# Patient Record
Sex: Female | Born: 1975 | Race: Black or African American | Hispanic: No
Health system: Midwestern US, Community
[De-identification: ages and names within clinical notes are randomized; demographics above are authoritative.]

## PROBLEM LIST (undated history)

## (undated) DIAGNOSIS — N809 Endometriosis, unspecified: Secondary | ICD-10-CM

## (undated) DIAGNOSIS — E7800 Pure hypercholesterolemia, unspecified: Secondary | ICD-10-CM

## (undated) DIAGNOSIS — K219 Gastro-esophageal reflux disease without esophagitis: Secondary | ICD-10-CM

## (undated) DIAGNOSIS — R519 Headache, unspecified: Secondary | ICD-10-CM

## (undated) DIAGNOSIS — D219 Benign neoplasm of connective and other soft tissue, unspecified: Secondary | ICD-10-CM

## (undated) DIAGNOSIS — F419 Anxiety disorder, unspecified: Secondary | ICD-10-CM

## (undated) DIAGNOSIS — R011 Cardiac murmur, unspecified: Secondary | ICD-10-CM

## (undated) HISTORY — PX: OVARIAN CYST SURGERY: SHX726

## (undated) HISTORY — PX: BREAST SURGERY: SHX581

## (undated) HISTORY — PX: COLPOSCOPY: SHX161

## (undated) HISTORY — DX: Anxiety disorder, unspecified: F41.9

## (undated) HISTORY — DX: Pure hypercholesterolemia, unspecified: E78.00

## (undated) HISTORY — PX: LAPAROSCOPY: SHX197

## (undated) HISTORY — PX: ABDOMINAL HYSTERECTOMY: SHX81

## (undated) HISTORY — DX: Cardiac murmur, unspecified: R01.1

---

## 1997-05-01 HISTORY — PX: OVARIAN CYST SURGERY: SHX726

## 1997-05-01 HISTORY — PX: LAPAROSCOPY: SHX197

## 2011-08-14 ENCOUNTER — Encounter (HOSPITAL_BASED_OUTPATIENT_CLINIC_OR_DEPARTMENT_OTHER): Payer: Self-pay | Admitting: *Deleted

## 2011-08-14 ENCOUNTER — Emergency Department (HOSPITAL_BASED_OUTPATIENT_CLINIC_OR_DEPARTMENT_OTHER)
Admission: EM | Admit: 2011-08-14 | Discharge: 2011-08-14 | Disposition: A | Discharge status: Home / Self Care | Payer: Self-pay | Attending: Emergency Medicine | Admitting: Emergency Medicine

## 2011-08-14 DIAGNOSIS — R109 Unspecified abdominal pain: Secondary | ICD-10-CM | POA: Insufficient documentation

## 2011-08-14 DIAGNOSIS — M791 Myalgia, unspecified site: Secondary | ICD-10-CM

## 2011-08-14 DIAGNOSIS — R1110 Vomiting, unspecified: Secondary | ICD-10-CM | POA: Insufficient documentation

## 2011-08-14 DIAGNOSIS — IMO0001 Reserved for inherently not codable concepts without codable children: Secondary | ICD-10-CM | POA: Insufficient documentation

## 2011-08-14 DIAGNOSIS — R197 Diarrhea, unspecified: Secondary | ICD-10-CM | POA: Insufficient documentation

## 2011-08-14 DIAGNOSIS — F17200 Nicotine dependence, unspecified, uncomplicated: Secondary | ICD-10-CM | POA: Insufficient documentation

## 2011-08-14 LAB — COMPREHENSIVE METABOLIC PANEL
Alkaline Phosphatase: 92 U/L (ref 39–117)
BUN: 10 mg/dL (ref 6–23)
Creatinine, Ser: 0.7 mg/dL (ref 0.50–1.10)
GFR calc Af Amer: >90 mL/min (ref >90)
Glucose, Bld: 90 mg/dL (ref 70–99)
Potassium: 4.2 mEq/L (ref 3.5–5.1)
Total Bilirubin: 0.9 mg/dL (ref 0.3–1.2)
Total Protein: 8.7 g/dL — ABNORMAL HIGH (ref 6.0–8.3)

## 2011-08-14 LAB — URINALYSIS, ROUTINE W REFLEX MICROSCOPIC
Bilirubin Urine: NEGATIVE
Glucose, UA: NEGATIVE mg/dL
Ketones, ur: NEGATIVE mg/dL
Protein, ur: NEGATIVE mg/dL
Urobilinogen, UA: 0.2 mg/dL (ref 0.0–1.0)

## 2011-08-14 LAB — URINE MICROSCOPIC-ADD ON

## 2011-08-14 LAB — CBC
HCT: 41.7 % (ref 36.0–46.0)
Hemoglobin: 13.9 g/dL (ref 12.0–15.0)
MCH: 22.1 pg — ABNORMAL LOW (ref 26.0–34.0)
MCHC: 33.3 g/dL (ref 30.0–36.0)
MCV: 66.2 fL — ABNORMAL LOW (ref 78.0–100.0)
RDW: 17.4 % — ABNORMAL HIGH (ref 11.5–15.5)

## 2011-08-14 LAB — DIFFERENTIAL
Basophils Relative: 0 % (ref 0–1)
Eosinophils Absolute: 0 10*3/uL (ref 0.0–0.7)
Lymphocytes Relative: 7 % — ABNORMAL LOW (ref 12–46)
Lymphs Abs: 0.8 10*3/uL (ref 0.7–4.0)
Neutro Abs: 10.1 10*3/uL — ABNORMAL HIGH (ref 1.7–7.7)

## 2011-08-14 LAB — PREGNANCY, URINE: Preg Test, Ur: NEGATIVE

## 2011-08-14 MED ORDER — KETOROLAC TROMETHAMINE 30 MG/ML IJ SOLN
30.0000 mg | Freq: Once | INTRAMUSCULAR | Status: AC
  Administered 2011-08-14: 30 mg via INTRAVENOUS
  Filled 2011-08-14: qty 1

## 2011-08-14 MED ORDER — ONDANSETRON HCL 4 MG/2ML IJ SOLN
4.0000 mg | Freq: Once | INTRAMUSCULAR | Status: AC
  Administered 2011-08-14: 4 mg via INTRAVENOUS
  Filled 2011-08-14: qty 2

## 2011-08-14 MED ORDER — DIPHENOXYLATE-ATROPINE 2.5-0.025 MG PO TABS: 1.0000 | ORAL_TABLET | Freq: Four times a day (QID) | ORAL | Status: AC | PRN

## 2011-08-14 MED ORDER — IBUPROFEN 600 MG PO TABS: 600.0000 mg | ORAL_TABLET | Freq: Four times a day (QID) | ORAL | Status: AC | PRN

## 2011-08-14 MED ORDER — METHOCARBAMOL 500 MG PO TABS: 500.0000 mg | ORAL_TABLET | Freq: Two times a day (BID) | ORAL | Status: AC

## 2011-08-14 MED ORDER — METHOCARBAMOL 100 MG/ML IJ SOLN
1000.0000 mg | Freq: Once | INTRAMUSCULAR | Status: AC
  Administered 2011-08-14: 1000 mg via INTRAVENOUS
  Filled 2011-08-14: qty 10

## 2011-08-14 MED ORDER — LOPERAMIDE HCL 2 MG PO CAPS
2.0000 mg | ORAL_CAPSULE | ORAL | Status: DC | PRN
  Administered 2011-08-14: 2 mg via ORAL
  Filled 2011-08-14: qty 1

## 2011-08-14 MED ORDER — SODIUM CHLORIDE 0.9 % IV BOLUS (SEPSIS)
1000.0000 mL | Freq: Once | INTRAVENOUS | Status: AC
  Administered 2011-08-14: 1000 mL via INTRAVENOUS

## 2011-08-14 NOTE — ED Notes (Signed)
Vomiting diarrhea body aches since yesterday.

## 2011-08-14 NOTE — ED Provider Notes (Signed)
History     CSN: [id number]  Arrival date & time 08/14/11  1300   First MD Initiated Contact with Patient 08/14/11 1314      Chief Complaint  Patient presents with  . GI Problem    (Consider location/radiation/quality/duration/timing/severity/associated sxs/prior treatment) HPI Pt with vomiting starting this AM at 0300 and has had 1 other episode. Pt's main complaints is constant diarrhea starting this AM and body aches. States she ate at a party yesterday evening. No sick contacts. No fever, chills. Abd pain that is generalized and cramping.  History reviewed. No pertinent past medical history.  Past Surgical History  Procedure Date  . Colposcopy     No family history on file.  History  Substance Use Topics  . Smoking status: Current Some Day Smoker  . Smokeless tobacco: Not on file  . Alcohol Use: Yes    OB History    Grav Para Term Preterm Abortions TAB SAB Ect Mult Living                  Review of Systems  Constitutional: Positive for fatigue. Negative for fever and chills.  Gastrointestinal: Positive for nausea, vomiting, abdominal pain and diarrhea. Negative for constipation, blood in stool and abdominal distention.  Genitourinary: Negative for dysuria.  Musculoskeletal: Positive for myalgias.  Skin: Negative for rash and wound.  Neurological: Negative for dizziness, weakness, numbness and headaches.    Allergies  Review of patient's allergies indicates no known allergies.  Home Medications   Current Outpatient Rx  Name Route Sig Dispense Refill  . DIPHENOXYLATE-ATROPINE 2.5-0.025 MG PO TABS Oral Take 1 tablet by mouth 4 (four) times daily as needed for diarrhea or loose stools. 30 tablet 0  . IBUPROFEN 600 MG PO TABS Oral Take 1 tablet (600 mg total) by mouth every 6 (six) hours as needed for pain. 30 tablet 0  . METHOCARBAMOL 500 MG PO TABS Oral Take 1 tablet (500 mg total) by mouth 2 (two) times daily. 20 tablet 0    BP 115/98  Pulse 88   Temp(Src) 99 F (37.2 C) (Oral)  Resp 22  Ht 5\' 4"  (1.626 m)  Wt 120 lb (54.432 kg)  BMI 20.60 kg/m2  SpO2 100%  Physical Exam  Nursing note and vitals reviewed. Constitutional: She is oriented to person, place, and time. She appears well-developed and well-nourished. No distress.  HENT:  Head: Normocephalic and atraumatic.  Mouth/Throat: Oropharynx is clear and moist.  Eyes: EOM are normal. Pupils are equal, round, and reactive to light.  Neck: Normal range of motion. Neck supple.  Cardiovascular: Normal rate and regular rhythm.   Pulmonary/Chest: Effort normal and breath sounds normal. No respiratory distress. She has no wheezes. She has no rales.  Abdominal: Soft. She exhibits distension. She exhibits no mass. There is tenderness. There is no rebound and no guarding.       Hyperactive BS, diffuse, non focal abd tenderness. No guarding or rebound.   Musculoskeletal: Normal range of motion. She exhibits tenderness (mild muscular ttp bl quad and flanks. ). She exhibits no edema.  Neurological: She is alert and oriented to person, place, and time.       5/5 motor, sensation intact. Stable gait  Skin: Skin is warm and dry. No rash noted. No erythema.  Psychiatric: She has a normal mood and affect. Her behavior is normal.    ED Course  Procedures (including critical care time)  Labs Reviewed  URINALYSIS, ROUTINE W REFLEX MICROSCOPIC - Abnormal;  Notable for the following:    Hgb urine dipstick SMALL (*)    All other components within normal limits  CBC - Abnormal; Notable for the following:    WBC 11.2 (*)    RBC 6.30 (*)    MCV 66.2 (*)    MCH 22.1 (*)    RDW 17.4 (*)    All other components within normal limits  DIFFERENTIAL - Abnormal; Notable for the following:    Neutrophils Relative 90 (*)    Lymphocytes Relative 7 (*)    Neutro Abs 10.1 (*)    All other components within normal limits  COMPREHENSIVE METABOLIC PANEL - Abnormal; Notable for the following:    Total  Protein 8.7 (*)    All other components within normal limits  URINE MICROSCOPIC-ADD ON - Abnormal; Notable for the following:    Squamous Epithelial / LPF FEW (*)    Bacteria, UA FEW (*)    All other components within normal limits  PREGNANCY, URINE   No results found.   1. Diarrhea   2. Muscle ache       MDM   Pt states she is feeling better. Return for worsening pain, fever, or any concerns       Loren Racer, MD 08/14/11 1451

## 2011-08-14 NOTE — Discharge Instructions (Signed)
Diarrhea Infections caused by germs (bacterial) or a virus commonly cause diarrhea. Your caregiver has determined that with time, rest and fluids, the diarrhea should improve. In general, eat normally while drinking more water than usual. Although water may prevent dehydration, it does not contain salt and minerals (electrolytes). Broths, weak tea without caffeine and oral rehydration solutions (ORS) replace fluids and electrolytes. Small amounts of fluids should be taken frequently. Large amounts at one time may not be tolerated. Plain water may be harmful in infants and the elderly. Oral rehydrating solutions (ORS) are available at pharmacies and grocery stores. ORS replace water and important electrolytes in proper proportions. Sports drinks are not as effective as ORS and may be harmful due to sugars worsening diarrhea.  ORS is especially recommended for use in children with diarrhea. As a general guideline for children, replace any new fluid losses from diarrhea and/or vomiting with ORS as follows:   If your child weighs 22 pounds or under (10 kg or less), give 60-120 mL ( -  cup or 2 - 4 ounces) of ORS for each episode of diarrheal stool or vomiting episode.   If your child weighs more than 22 pounds (more than 10 kgs), give 120-240 mL ( - 1 cup or 4 - 8 ounces) of ORS for each diarrheal stool or episode of vomiting.   While correcting for dehydration, children should eat normally. However, foods high in sugar should be avoided because this may worsen diarrhea. Large amounts of carbonated soft drinks, juice, gelatin desserts and other highly sugared drinks should be avoided.   After correction of dehydration, other liquids that are appealing to the child may be added. Children should drink small amounts of fluids frequently and fluids should be increased as tolerated. Children should drink enough fluids to keep urine clear or pale yellow.   Adults should eat normally while drinking more fluids  than usual. Drink small amounts of fluids frequently and increase as tolerated. Drink enough fluids to keep urine clear or pale yellow. Broths, weak decaffeinated tea, lemon lime soft drinks (allowed to go flat) and ORS replace fluids and electrolytes.   Avoid:   Carbonated drinks.   Juice.   Extremely hot or cold fluids.   Caffeine drinks.   Fatty, greasy foods.   Alcohol.   Tobacco.   Too much intake of anything at one time.   Gelatin desserts.   Probiotics are active cultures of beneficial bacteria. They may lessen the amount and number of diarrheal stools in adults. Probiotics can be found in yogurt with active cultures and in supplements.   Wash hands well to avoid spreading bacteria and virus.   Anti-diarrheal medications are not recommended for infants and children.   Only take over-the-counter or prescription medicines for pain, discomfort or fever as directed by your caregiver. Do not give aspirin to children because it may cause Reye's Syndrome.   For adults, ask your caregiver if you should continue all prescribed and over-the-counter medicines.   If your caregiver has given you a follow-up appointment, it is very important to keep that appointment. Not keeping the appointment could result in a chronic or permanent injury, and disability. If there is any problem keeping the appointment, you must call back to this facility for assistance.  SEEK IMMEDIATE MEDICAL CARE IF:   You or your child is unable to keep fluids down or other symptoms or problems become worse in spite of treatment.   Vomiting or diarrhea develops and becomes persistent.  There is vomiting of blood or bile (green material).   There is blood in the stool or the stools are black and tarry.   There is no urine output in 6-8 hours or there is only a small amount of very dark urine.   Abdominal pain develops, increases or localizes.   You have a fever.   Your baby is older than 3 months with a  rectal temperature of 102 F (38.9 C) or higher.   Your baby is 72 months old or younger with a rectal temperature of 100.4 F (38 C) or higher.   You or your child develops excessive weakness, dizziness, fainting or extreme thirst.   You or your child develops a rash, stiff neck, severe headache or become irritable or sleepy and difficult to awaken.  MAKE SURE YOU:   Understand these instructions.   Will watch your condition.   Will get help right away if you are not doing well or get worse.  Document Released: 04/07/2002 Document Revised: 04/06/2011 Document Reviewed: 02/22/2009 [facility] Patient Information 2012 [place], [place].

## 2012-04-28 ENCOUNTER — Emergency Department (HOSPITAL_BASED_OUTPATIENT_CLINIC_OR_DEPARTMENT_OTHER)
Admission: EM | Admit: 2012-04-28 | Discharge: 2012-04-29 | Disposition: A | Discharge status: Home / Self Care | Payer: Self-pay | Attending: Emergency Medicine | Admitting: Emergency Medicine

## 2012-04-28 ENCOUNTER — Encounter (HOSPITAL_BASED_OUTPATIENT_CLINIC_OR_DEPARTMENT_OTHER): Payer: Self-pay | Admitting: *Deleted

## 2012-04-28 DIAGNOSIS — R102 Pelvic and perineal pain: Secondary | ICD-10-CM

## 2012-04-28 DIAGNOSIS — R110 Nausea: Secondary | ICD-10-CM | POA: Insufficient documentation

## 2012-04-28 DIAGNOSIS — M549 Dorsalgia, unspecified: Secondary | ICD-10-CM | POA: Insufficient documentation

## 2012-04-28 DIAGNOSIS — N949 Unspecified condition associated with female genital organs and menstrual cycle: Secondary | ICD-10-CM | POA: Insufficient documentation

## 2012-04-28 DIAGNOSIS — R609 Edema, unspecified: Secondary | ICD-10-CM | POA: Insufficient documentation

## 2012-04-28 DIAGNOSIS — F17200 Nicotine dependence, unspecified, uncomplicated: Secondary | ICD-10-CM | POA: Insufficient documentation

## 2012-04-28 LAB — URINALYSIS, ROUTINE W REFLEX MICROSCOPIC
Bilirubin Urine: NEGATIVE
Glucose, UA: NEGATIVE mg/dL
Ketones, ur: NEGATIVE mg/dL
pH: 7 (ref 5.0–8.0)

## 2012-04-28 LAB — WET PREP, GENITAL: Trich, Wet Prep: NONE SEEN

## 2012-04-28 MED ORDER — ONDANSETRON 8 MG PO TBDP: 8.0000 mg | ORAL_TABLET | Freq: Three times a day (TID) | ORAL | Status: DC | PRN

## 2012-04-28 MED ORDER — HYDROCODONE-ACETAMINOPHEN 5-325 MG PO TABS: 1.0000 | ORAL_TABLET | Freq: Four times a day (QID) | ORAL | Status: DC | PRN

## 2012-04-28 MED ORDER — HYDROCODONE-ACETAMINOPHEN 5-325 MG PO TABS
1.0000 | ORAL_TABLET | Freq: Once | ORAL | Status: AC
  Administered 2012-04-29: 1 via ORAL
  Filled 2012-04-28: qty 1

## 2012-04-28 MED ORDER — ONDANSETRON 4 MG PO TBDP
4.0000 mg | ORAL_TABLET | Freq: Once | ORAL | Status: AC
  Administered 2012-04-29: 4 mg via ORAL
  Filled 2012-04-28: qty 1

## 2012-04-28 NOTE — ED Notes (Addendum)
Pt reports she normally has to take birth control pill to prevent painful cycles but has ran out since moving her from [place]. 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  Denies discharge or bleeding and denies pregnancy d/t lesbian life style.

## 2012-04-28 NOTE — ED Provider Notes (Addendum)
History  This chart was scribed for Hanley Seamen, MD by Ardeen Jourdain, ED Scribe. This patient was seen in room MH02/MH02 and the patient's care was started at 2314.  CSN: [id number]  Arrival date & time 04/28/12  2248   First MD Initiated Contact with Patient 04/28/12 2314      Chief Complaint  Patient presents with  . Abdominal Pain     The history is provided by the patient. No language interpreter was used.    Jillian Vega is a 36 y.o. female who presents to the Emergency Department complaining of left suprapubic abdominal pain with associated back pain and nausea that began 2 days ago. She denies fever, chills, CP, SOB, emesis, diarrhea and vaginal discharge as associated symptoms. She states she has had similar pain in the past about two weeks before she had her menstrual cycle. She reports the current pain is more severe (7/10) and different in character ("puling") than her past pain. She also reports taking ibuprofen for the pain with no relief.  History reviewed. No pertinent past medical history.  Past Surgical History  Procedure Date  . Colposcopy     History reviewed. No pertinent family history.  History  Substance Use Topics  . Smoking status: Current Some Day Smoker  . Smokeless tobacco: Not on file  . Alcohol Use: Yes   No OB history available.   Review of Systems  All other systems reviewed and are negative.   A complete 10 system review of systems was obtained and all systems are negative except as noted in the HPI and PMH.    Allergies  Review of patient's allergies indicates no known allergies.  Home Medications   Current Outpatient Rx  Name  Route  Sig  Dispense  Refill  . ACETAMINOPHEN 500 MG PO TABS   Oral   Take 500 mg by mouth every 6 (six) hours as needed. Patient used this medication for her stomach pain.         Marland Kitchen AMOXICILLIN PO   Oral   Take 1 tablet by mouth 2 (two) times daily. Patient states that her cousin gave her this  medication because she was suffering with a sore throat.  She took the medication for two days, using to tablets both days.         . ASPIRIN-ACETAMINOPHEN-CAFFEINE 250-250-65 MG PO TABS   Oral   Take 1 tablet by mouth every 6 (six) hours as needed. Patient used this medication for a severe headache.           Triage Vitals: BP 131/63  Pulse 67  Temp 98.1 F (36.7 C) (Oral)  Resp 20  Ht 5\' 3"  (1.6 m)  Wt 127 lb (57.607 kg)  BMI 22.50 kg/m2  SpO2 100%  LMP 04/14/2012  Physical Exam  Nursing note and vitals reviewed. Constitutional: She is oriented to person, place, and time. She appears well-developed and well-nourished. No distress.  HENT:  Head: Normocephalic and atraumatic.  Mouth/Throat: Oropharynx is clear and moist. No oropharyngeal exudate.  Eyes: Conjunctivae normal and EOM are normal. Pupils are equal, round, and reactive to light.  Neck: Normal range of motion. Neck supple. No tracheal deviation present.  Cardiovascular: Normal rate, regular rhythm, normal heart sounds and intact distal pulses.   Pulmonary/Chest: Effort normal and breath sounds normal. No respiratory distress.  Abdominal: Soft. Bowel sounds are normal. She exhibits no distension. There is tenderness.       Left suprapubic tenderness  Musculoskeletal: Normal range of motion. She exhibits no edema and no tenderness.       No pitting edema, extremities unremarkable.   Neurological: She is alert and oriented to person, place, and time.  Skin: Skin is warm and dry.  Psychiatric: She has a normal mood and affect. Her behavior is normal.    ED Course  Procedures (including critical care time)  DIAGNOSTIC STUDIES: Oxygen Saturation is 100% on room air, normal by my interpretation.    COORDINATION OF CARE:  11:15 PM: Discussed treatment plan which includes a urinalysis and ultrasound with pt at bedside and pt agreed to plan.       MDM   Nursing notes and vitals signs, including pulse  oximetry, reviewed.  Summary of this visit's results, reviewed by myself:  Labs:  Results for orders placed during the hospital encounter of 04/28/12 (from the past 24 hour(s))  URINALYSIS, ROUTINE W REFLEX MICROSCOPIC     Status: Normal   Collection Time   04/28/12 10:55 PM      Component Value Range   Color, Urine YELLOW  YELLOW   APPearance CLEAR  CLEAR   Specific Gravity, Urine 1.018  1.005 - 1.030   pH 7.0  5.0 - 8.0   Glucose, UA NEGATIVE  NEGATIVE mg/dL   Hgb urine dipstick NEGATIVE  NEGATIVE   Bilirubin Urine NEGATIVE  NEGATIVE   Ketones, ur NEGATIVE  NEGATIVE mg/dL   Protein, ur NEGATIVE  NEGATIVE mg/dL   Urobilinogen, UA 1.0  0.0 - 1.0 mg/dL   Nitrite NEGATIVE  NEGATIVE   Leukocytes, UA NEGATIVE  NEGATIVE  PREGNANCY, URINE     Status: Normal   Collection Time   04/28/12 10:55 PM      Component Value Range   Preg Test, Ur NEGATIVE  NEGATIVE  WET PREP, GENITAL     Status: Abnormal   Collection Time   04/28/12 11:32 PM      Component Value Range   Yeast Wet Prep HPF POC NONE SEEN  NONE SEEN   Trich, Wet Prep NONE SEEN  NONE SEEN   Clue Cells Wet Prep HPF POC MODERATE (*) NONE SEEN   WBC, Wet Prep HPF POC FEW (*) NONE SEEN   We'll have her return tomorrow for an ultrasound. Patient has a history of ovarian cysts. Will also route for her to [facility] for definitive gynecologic care.   I personally performed the services described in this documentation, which was scribed in my presence.  The recorded information has been reviewed and is accurate.Hanley Seamen, MD 04/28/12 8295  Hanley Seamen, MD 04/29/12 6213

## 2012-04-28 NOTE — ED Notes (Addendum)
Pt c/o low back and abd pain x 2 days. +nausea. Denies other s/s. Breasts sore. Was on birth control pills to regulate cycle, but stopped about 3 months ago. Pt is sexually active, but not with men.

## 2012-04-29 ENCOUNTER — Ambulatory Visit (HOSPITAL_BASED_OUTPATIENT_CLINIC_OR_DEPARTMENT_OTHER)
Admission: RE | Admit: 2012-04-29 | Discharge: 2012-04-29 | Disposition: A | Discharge status: Home / Self Care | Payer: Self-pay | Source: Ambulatory Visit | Attending: Emergency Medicine | Admitting: Emergency Medicine

## 2012-04-29 ENCOUNTER — Other Ambulatory Visit (HOSPITAL_BASED_OUTPATIENT_CLINIC_OR_DEPARTMENT_OTHER): Payer: Self-pay | Admitting: Emergency Medicine

## 2012-04-29 DIAGNOSIS — R52 Pain, unspecified: Secondary | ICD-10-CM

## 2012-04-29 DIAGNOSIS — R109 Unspecified abdominal pain: Secondary | ICD-10-CM | POA: Insufficient documentation

## 2012-04-29 DIAGNOSIS — N83209 Unspecified ovarian cyst, unspecified side: Secondary | ICD-10-CM | POA: Insufficient documentation

## 2012-04-29 NOTE — ED Provider Notes (Signed)
Pt's follow up U/S showed complex cyst, likely hemorrhagic, consistent with pt's prior history.  I encouraged [facility] follow [facility].  Pt was given analgesics from ED visit the night prior.    Gavin Pound. Jakhari Space, MD 04/29/12 1446

## 2012-04-30 LAB — GC/CHLAMYDIA PROBE AMP
CT Probe RNA: NEGATIVE
GC Probe RNA: NEGATIVE

## 2012-05-04 ENCOUNTER — Encounter (HOSPITAL_BASED_OUTPATIENT_CLINIC_OR_DEPARTMENT_OTHER): Payer: Self-pay | Admitting: *Deleted

## 2012-05-04 ENCOUNTER — Emergency Department (HOSPITAL_BASED_OUTPATIENT_CLINIC_OR_DEPARTMENT_OTHER)
Admission: EM | Admit: 2012-05-04 | Discharge: 2012-05-04 | Disposition: A | Discharge status: Home / Self Care | Payer: Self-pay | Attending: Emergency Medicine | Admitting: Emergency Medicine

## 2012-05-04 DIAGNOSIS — Z3202 Encounter for pregnancy test, result negative: Secondary | ICD-10-CM | POA: Insufficient documentation

## 2012-05-04 DIAGNOSIS — K529 Noninfective gastroenteritis and colitis, unspecified: Secondary | ICD-10-CM

## 2012-05-04 DIAGNOSIS — F17200 Nicotine dependence, unspecified, uncomplicated: Secondary | ICD-10-CM | POA: Insufficient documentation

## 2012-05-04 DIAGNOSIS — Z79899 Other long term (current) drug therapy: Secondary | ICD-10-CM | POA: Insufficient documentation

## 2012-05-04 DIAGNOSIS — R112 Nausea with vomiting, unspecified: Secondary | ICD-10-CM | POA: Insufficient documentation

## 2012-05-04 DIAGNOSIS — Z7982 Long term (current) use of aspirin: Secondary | ICD-10-CM | POA: Insufficient documentation

## 2012-05-04 DIAGNOSIS — K5289 Other specified noninfective gastroenteritis and colitis: Secondary | ICD-10-CM | POA: Insufficient documentation

## 2012-05-04 DIAGNOSIS — E860 Dehydration: Secondary | ICD-10-CM | POA: Insufficient documentation

## 2012-05-04 LAB — CBC WITH DIFFERENTIAL/PLATELET
Basophils Absolute: 0 10*3/uL (ref 0.0–0.1)
Eosinophils Absolute: 0.1 10*3/uL (ref 0.0–0.7)
Lymphocytes Relative: 12 % (ref 12–46)
MCHC: 32.6 g/dL (ref 30.0–36.0)
Neutro Abs: 10.8 10*3/uL — ABNORMAL HIGH (ref 1.7–7.7)
Neutrophils Relative %: 82 % — ABNORMAL HIGH (ref 43–77)
Platelets: 225 10*3/uL (ref 150–400)
RDW: 16.9 % — ABNORMAL HIGH (ref 11.5–15.5)

## 2012-05-04 LAB — COMPREHENSIVE METABOLIC PANEL
ALT: 11 U/L (ref 0–35)
Calcium: 10.6 mg/dL — ABNORMAL HIGH (ref 8.4–10.5)
GFR calc Af Amer: >90 mL/min (ref >90)
Glucose, Bld: 93 mg/dL (ref 70–99)
Sodium: 137 mEq/L (ref 135–145)
Total Protein: 8.7 g/dL — ABNORMAL HIGH (ref 6.0–8.3)

## 2012-05-04 LAB — URINALYSIS, ROUTINE W REFLEX MICROSCOPIC
Leukocytes, UA: NEGATIVE
Nitrite: NEGATIVE
Protein, ur: NEGATIVE mg/dL
Urobilinogen, UA: 0.2 mg/dL (ref 0.0–1.0)

## 2012-05-04 LAB — PREGNANCY, URINE: Preg Test, Ur: NEGATIVE

## 2012-05-04 MED ORDER — KETOROLAC TROMETHAMINE 30 MG/ML IJ SOLN
30.0000 mg | Freq: Once | INTRAMUSCULAR | Status: AC
  Administered 2012-05-04: 30 mg via INTRAVENOUS
  Filled 2012-05-04: qty 1

## 2012-05-04 MED ORDER — PROMETHAZINE HCL 25 MG PO TABS: 25.0000 mg | ORAL_TABLET | Freq: Four times a day (QID) | ORAL | Status: DC | PRN

## 2012-05-04 MED ORDER — SODIUM CHLORIDE 0.9 % IV BOLUS (SEPSIS)
2000.0000 mL | Freq: Once | INTRAVENOUS | Status: AC
  Administered 2012-05-04: 2000 mL via INTRAVENOUS

## 2012-05-04 MED ORDER — ONDANSETRON HCL 4 MG/2ML IJ SOLN
4.0000 mg | Freq: Once | INTRAMUSCULAR | Status: AC
  Administered 2012-05-04: 4 mg via INTRAVENOUS
  Filled 2012-05-04: qty 2

## 2012-05-04 NOTE — ED Provider Notes (Signed)
History     CSN: [id number]  Arrival date & time 05/04/12  1478   First MD Initiated Contact with Patient 05/04/12 [phone]      Chief Complaint  Patient presents with  . Abdominal Pain    (Consider location/radiation/quality/duration/timing/severity/associated sxs/prior treatment) The history is provided by the patient.  Jillian Vega is a 37 y.o. female here with nausea vomiting and diarrhea. She was recently here for hemorrhagic cyst. However today she developed epigastric pain and several episodes of vomiting. She also has several episodes of diarrhea. He has some sick contacts with similar symptoms. No recent antibiotics or recent travel. No urinary symptoms.    History reviewed. No pertinent past medical history.  Past Surgical History  Procedure Date  . Colposcopy     History reviewed. No pertinent family history.  History  Substance Use Topics  . Smoking status: Current Some Day Smoker  . Smokeless tobacco: Not on file  . Alcohol Use: Yes    OB History    Grav Para Term Preterm Abortions TAB SAB Ect Mult Living                  Review of Systems  Gastrointestinal: Positive for nausea, vomiting, abdominal pain and diarrhea.  All other systems reviewed and are negative.    Allergies  Review of patient's allergies indicates no known allergies.  Home Medications   Current Outpatient Rx  Name  Route  Sig  Dispense  Refill  . ACETAMINOPHEN 500 MG PO TABS   Oral   Take 500 mg by mouth every 6 (six) hours as needed. Patient used this medication for her stomach pain.         Marland Kitchen AMOXICILLIN PO   Oral   Take 1 tablet by mouth 2 (two) times daily. Patient states that her cousin gave her this medication because she was suffering with a sore throat.  She took the medication for two days, using to tablets both days.         . ASPIRIN-ACETAMINOPHEN-CAFFEINE 250-250-65 MG PO TABS   Oral   Take 1 tablet by mouth every 6 (six) hours as needed. Patient used this  medication for a severe headache.         Marland Kitchen HYDROCODONE-ACETAMINOPHEN 5-325 MG PO TABS   Oral   Take 1-2 tablets by mouth every 6 (six) hours as needed for pain.   20 tablet   0   . ONDANSETRON 8 MG PO TBDP   Oral   Take 1 tablet (8 mg total) by mouth every 8 (eight) hours as needed for nausea.   10 tablet   0     BP 123/83  Pulse 97  Temp 100 F (37.8 C) (Oral)  Resp 20  Ht 5\' 3"  (1.6 m)  Wt 127 lb (57.607 kg)  BMI 22.50 kg/m2  SpO2 100%  LMP 04/14/2012  Physical Exam  Nursing note and vitals reviewed. Constitutional: She is oriented to person, place, and time.       Uncomfortable  HENT:  Head: Normocephalic.       MM slightly dry   Eyes: Conjunctivae normal are normal. Pupils are equal, round, and reactive to light.  Neck: Normal range of motion. Neck supple.  Cardiovascular: Normal rate, regular rhythm and normal heart sounds.   Pulmonary/Chest: Effort normal and breath sounds normal. No respiratory distress. She has no wheezes. She has no rales.  Abdominal: Soft. Bowel sounds are normal.       Mild  epigastric tenderness, no rebound. No RUQ tenderness.   Musculoskeletal: Normal range of motion.  Neurological: She is alert and oriented to person, place, and time.  Skin: Skin is warm and dry.  Psychiatric: She has a normal mood and affect. Her behavior is normal. Judgment and thought content normal.    ED Course  Procedures (including critical care time)  Labs Reviewed  CBC WITH DIFFERENTIAL - Abnormal; Notable for the following:    WBC 13.2 (*)     RBC 6.31 (*)     MCV 67.5 (*)     MCH 22.0 (*)     RDW 16.9 (*)     Neutrophils Relative 82 (*)     Neutro Abs 10.8 (*)     All other components within normal limits  COMPREHENSIVE METABOLIC PANEL - Abnormal; Notable for the following:    Calcium 10.6 (*)     Total Protein 8.7 (*)     All other components within normal limits  LIPASE, BLOOD  URINALYSIS, ROUTINE W REFLEX MICROSCOPIC  PREGNANCY, URINE    No results found.   No diagnosis found.    MDM  Jillian Vega is a 37 y.o. female here with ab pain, vomiting, diarrhea. Likely gastroenteritis. Will check labs, UA, UCG, give IVF and zofran and toradol and reassess.   5:13 AM Felt better after 2 L NS and meds. Labs unremarkable. WBC 13, slightly elevated, likely from gastroenteritis. Abdomen now soft and nontender. Will d/c home on phenergan prn.        Richardean Canal, MD 05/04/12 [phone]

## 2012-05-04 NOTE — ED Notes (Signed)
Pt c/o abd cramping, vomited x2 and diarrhea since Friday, denies fever

## 2012-05-04 NOTE — ED Notes (Signed)
Pt c/o abd pain with vomiting.  °

## 2012-05-09 ENCOUNTER — Encounter: Payer: Self-pay | Admitting: Obstetrics & Gynecology

## 2012-12-23 ENCOUNTER — Emergency Department (HOSPITAL_BASED_OUTPATIENT_CLINIC_OR_DEPARTMENT_OTHER)
Admission: EM | Admit: 2012-12-23 | Discharge: 2012-12-23 | Disposition: A | Discharge status: Home / Self Care | Payer: Self-pay | Attending: Emergency Medicine | Admitting: Emergency Medicine

## 2012-12-23 ENCOUNTER — Encounter (HOSPITAL_BASED_OUTPATIENT_CLINIC_OR_DEPARTMENT_OTHER): Payer: Self-pay

## 2012-12-23 DIAGNOSIS — Y9289 Other specified places as the place of occurrence of the external cause: Secondary | ICD-10-CM | POA: Insufficient documentation

## 2012-12-23 DIAGNOSIS — R21 Rash and other nonspecific skin eruption: Secondary | ICD-10-CM | POA: Insufficient documentation

## 2012-12-23 DIAGNOSIS — T148 Other injury of unspecified body region: Secondary | ICD-10-CM | POA: Insufficient documentation

## 2012-12-23 DIAGNOSIS — L539 Erythematous condition, unspecified: Secondary | ICD-10-CM | POA: Insufficient documentation

## 2012-12-23 DIAGNOSIS — W57XXXA Bitten or stung by nonvenomous insect and other nonvenomous arthropods, initial encounter: Secondary | ICD-10-CM | POA: Insufficient documentation

## 2012-12-23 DIAGNOSIS — F17200 Nicotine dependence, unspecified, uncomplicated: Secondary | ICD-10-CM | POA: Insufficient documentation

## 2012-12-23 DIAGNOSIS — Y9389 Activity, other specified: Secondary | ICD-10-CM | POA: Insufficient documentation

## 2012-12-23 MED ORDER — PREDNISONE 50 MG PO TABS: ORAL_TABLET | ORAL | Status: DC

## 2012-12-23 MED ORDER — FAMOTIDINE 20 MG PO TABS
20.0000 mg | ORAL_TABLET | Freq: Once | ORAL | Status: AC
Start: 2012-12-23 — End: 2012-12-23
  Administered 2012-12-23: 20 mg via ORAL
  Filled 2012-12-23: qty 1

## 2012-12-23 MED ORDER — DIPHENHYDRAMINE HCL 25 MG PO CAPS
25.0000 mg | ORAL_CAPSULE | Freq: Once | ORAL | Status: AC
  Administered 2012-12-23: 25 mg via ORAL
  Filled 2012-12-23: qty 1

## 2012-12-23 MED ORDER — DIPHENHYDRAMINE HCL 25 MG PO TABS: 25.0000 mg | ORAL_TABLET | Freq: Four times a day (QID) | ORAL | Status: DC

## 2012-12-23 MED ORDER — PREDNISONE 50 MG PO TABS
60.0000 mg | ORAL_TABLET | Freq: Once | ORAL | Status: AC
  Administered 2012-12-23: 60 mg via ORAL
  Filled 2012-12-23: qty 1

## 2012-12-23 NOTE — ED Notes (Signed)
scattered insect bites-first noticed last night while helping friend move

## 2012-12-23 NOTE — ED Provider Notes (Signed)
Scribed for No att. providers found, the patient was seen in room MHOTF/OTF. This chart was scribed by Lewanda Rife, ED scribe. Patient's care was started at 1846  CSN: [id number]     Arrival date & time 12/23/12  1825 History   First MD Initiated Contact with Patient 12/23/12 1841     Chief Complaint  Patient presents with  . Insect Bite   (Consider location/radiation/quality/duration/timing/severity/associated sxs/prior Treatment) The history is provided by the patient.   HPI Comments: Jillian Vega is a 37 y.o. female who presents to the Emergency Department complaining of severely pruritic unknown insect bites onset last night while helping friend move. Reports associated mild constant pain, and swelling. Reports pain is aggravated by touch and alleviated by nothing. Denies known contacts with similar bites. Denies associated shortness of breath, difficulty breathing, new detergents, new lotions, new soaps, recent camping, known allergies, lesions on genitals, and oral lesions. Denies significant PMH. Denies taking any OTC medications at home to alleviate symptoms.  History reviewed. No pertinent past medical history. Past Surgical History  Procedure Laterality Date  . Colposcopy     No family history on file. History  Substance Use Topics  . Smoking status: Current Some Day Smoker  . Smokeless tobacco: Not on file  . Alcohol Use: Yes   OB History   Grav Para Term Preterm Abortions TAB SAB Ect Mult Living                 Review of Systems  Skin: Positive for rash.  A complete 10 system review of systems was obtained and all systems are negative except as noted in the HPI and PMH.     Allergies  Review of patient's allergies indicates no known allergies.  Home Medications   Current Outpatient Rx  Name  Route  Sig  Dispense  Refill  . acetaminophen (TYLENOL) 500 MG tablet   Oral   Take 500 mg by mouth every 6 (six) hours as needed. Patient used this medication  for her stomach pain.         Marland Kitchen AMOXICILLIN PO   Oral   Take 1 tablet by mouth 2 (two) times daily. Patient states that her cousin gave her this medication because she was suffering with a sore throat.  She took the medication for two days, using to tablets both days.         Marland Kitchen aspirin-acetaminophen-caffeine (EXCEDRIN MIGRAINE) 250-250-65 MG per tablet   Oral   Take 1 tablet by mouth every 6 (six) hours as needed. Patient used this medication for a severe headache.         . diphenhydrAMINE (BENADRYL) 25 MG tablet   Oral   Take 1 tablet (25 mg total) by mouth every 6 (six) hours.   20 tablet   0   . HYDROcodone-acetaminophen (NORCO/VICODIN) 5-325 MG per tablet   Oral   Take 1-2 tablets by mouth every 6 (six) hours as needed for pain.   20 tablet   0   . ondansetron (ZOFRAN ODT) 8 MG disintegrating tablet   Oral   Take 1 tablet (8 mg total) by mouth every 8 (eight) hours as needed for nausea.   10 tablet   0   . predniSONE (DELTASONE) 50 MG tablet      1 tablet PO daily   5 tablet   0   . promethazine (PHENERGAN) 25 MG tablet   Oral   Take 1 tablet (25 mg total) by mouth every  6 (six) hours as needed for nausea.   15 tablet   0    BP 109/72  Pulse 79  Temp(Src) 98.9 F (37.2 C) (Oral)  Resp 16  Ht 5\' 3"  (1.6 m)  Wt 125 lb (56.7 kg)  BMI 22.15 kg/m2  SpO2 100%  LMP 12/13/2012 Physical Exam  Nursing note and vitals reviewed. Constitutional: She is oriented to person, place, and time. She appears well-developed and well-nourished. No distress.  HENT:  Head: Normocephalic and atraumatic.  No oral lesions Uvula midline, no asymmetry of pharnyx  Eyes: Conjunctivae and EOM are normal. No scleral icterus.  Neck: Neck supple. No tracheal deviation present.  Cardiovascular: Normal rate and regular rhythm.   Pulmonary/Chest: Effort normal and breath sounds normal. No respiratory distress. She has no wheezes.  Abdominal: Soft. There is no tenderness. There is no  rebound and no guarding.  Musculoskeletal: Normal range of motion. She exhibits no edema and no tenderness.  Neurological: She is alert and oriented to person, place, and time. No cranial nerve deficit. She exhibits normal muscle tone. Coordination normal.  Skin: Skin is warm and dry. Rash noted. There is erythema.  Scattered erythematous papules posterior neck, left forearm, right scapula No fluctuance  Psychiatric: She has a normal mood and affect. Her behavior is normal.    ED Course  Procedures (including critical care time) Medications  diphenhydrAMINE (BENADRYL) capsule 25 mg (25 mg Oral Given 12/23/12 1858)  predniSONE (DELTASONE) tablet 60 mg (60 mg Oral Given 12/23/12 1858)  famotidine (PEPCID) tablet 20 mg (20 mg Oral Given 12/23/12 1858)    Labs Review Labs Reviewed - No data to display Imaging Review No results found.  MDM   1. Bug bites    Erythematous and pruritic papules consistent with bug bites. No difficulty breathing or swallowing. No mucus membrane involvement.  Steroids, antihistamines. Return precautions discussed.  I personally performed the services described in this documentation, which was scribed in my presence. The recorded information has been reviewed and is accurate.   Glynn Octave, MD 12/23/12 [phone]

## 2013-08-16 IMAGING — US US TRANSVAGINAL NON-OB
1 series · 14 of 25 positions shown · non-contrast
Comparison: None

CLINICAL DATA: Left-sided pelvic pain.



[Series 1: us transvaginal non-ob · 0.26mm/px · 14 of 91 slices shown]
[im 1/91]
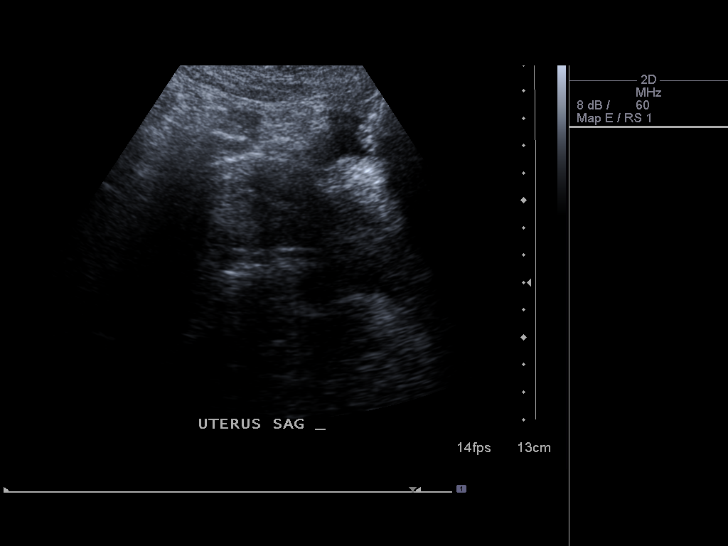
[im 8/91]
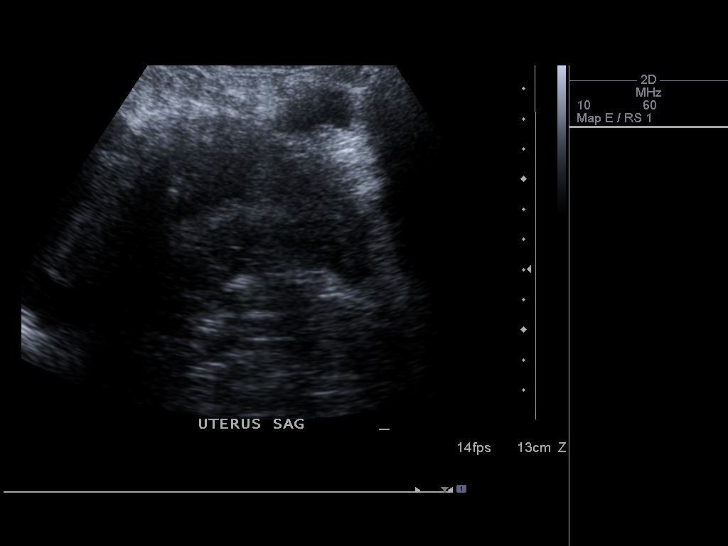
[im 16/91]
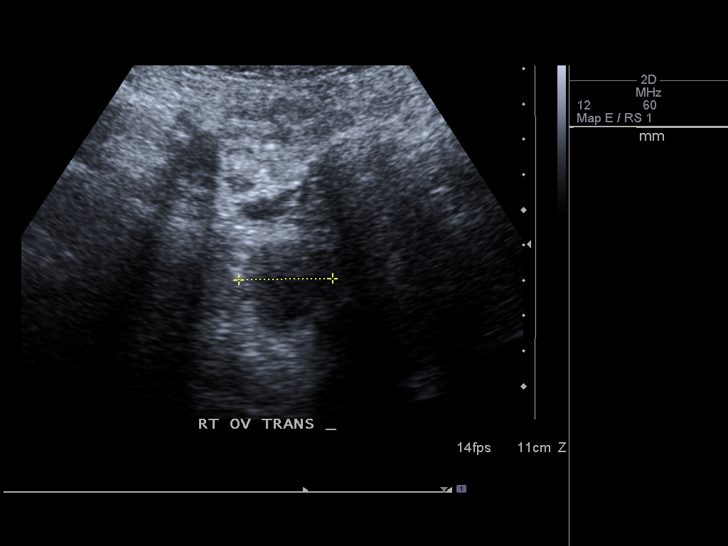
[im 23/91]
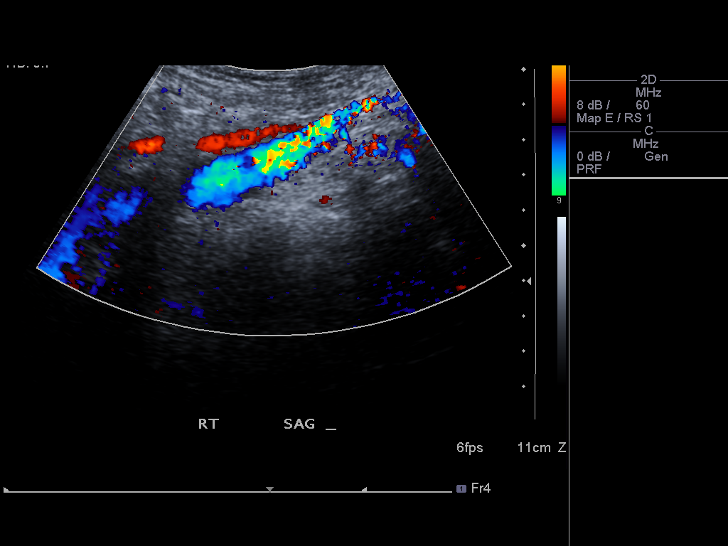
[im 31/91]
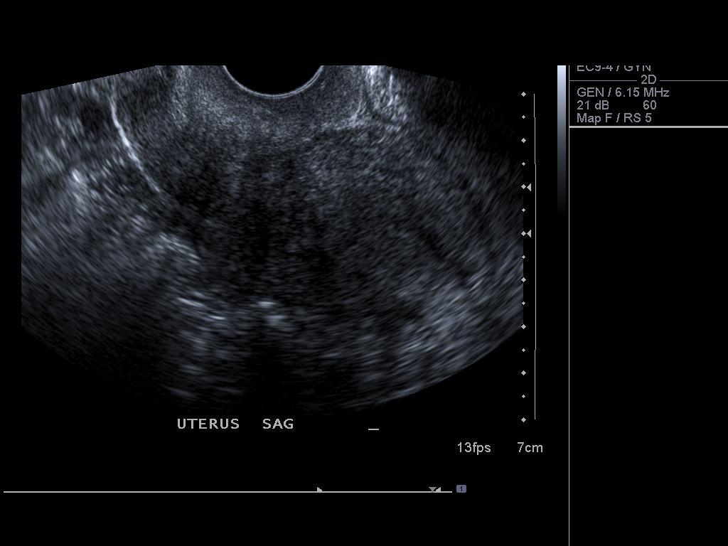
[im 34/91]
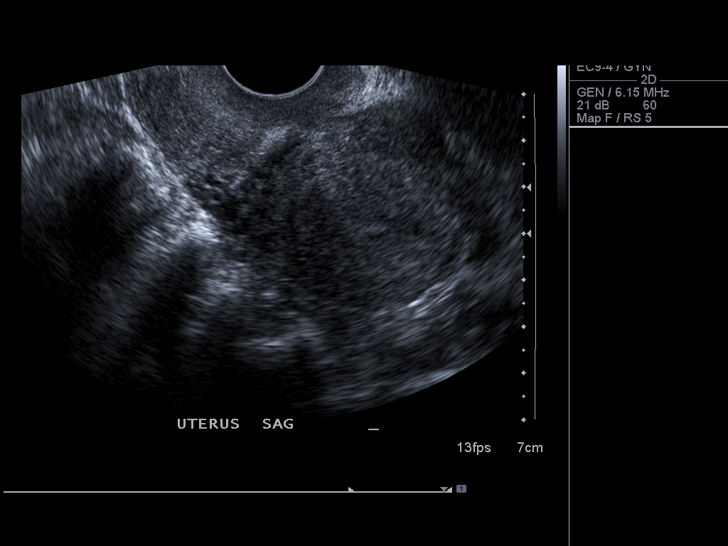
[im 42/91]
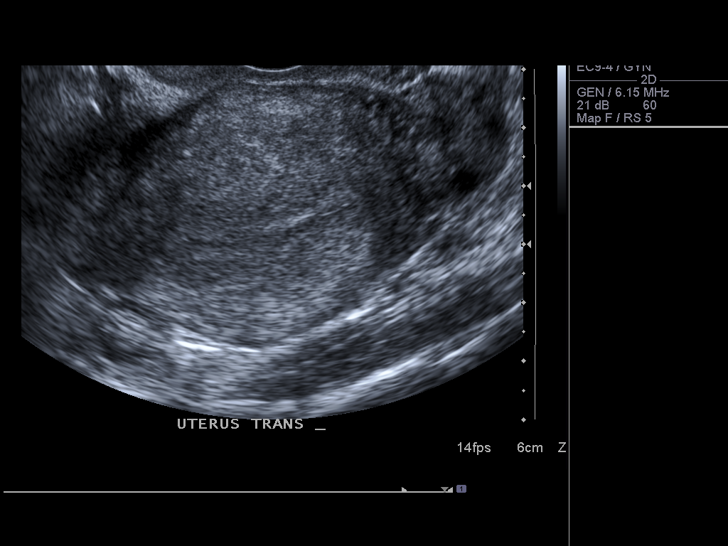
[im 49/91]
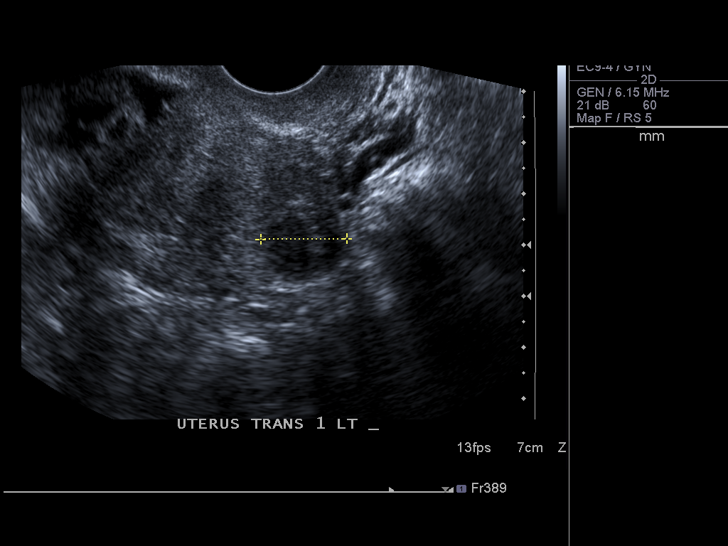
[im 57/91]
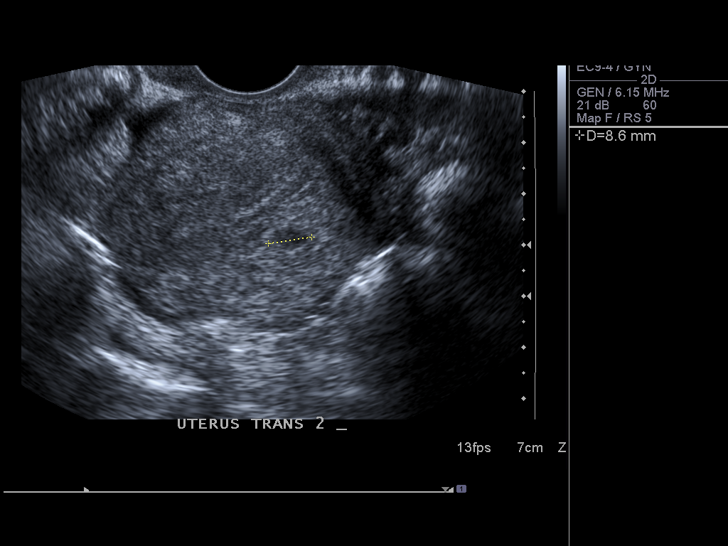
[im 61/91]
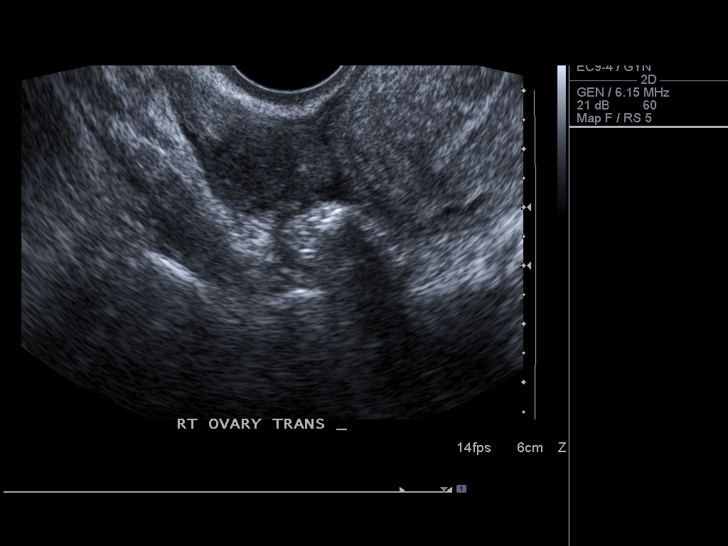
[im 68/91]
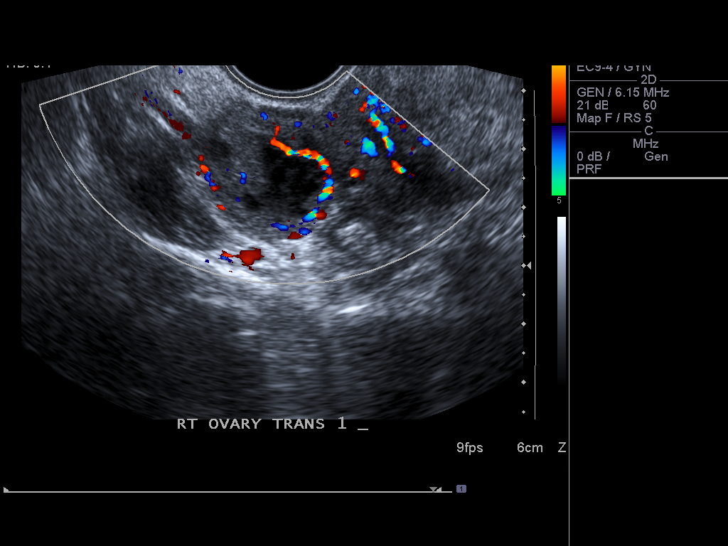
[im 76/91]
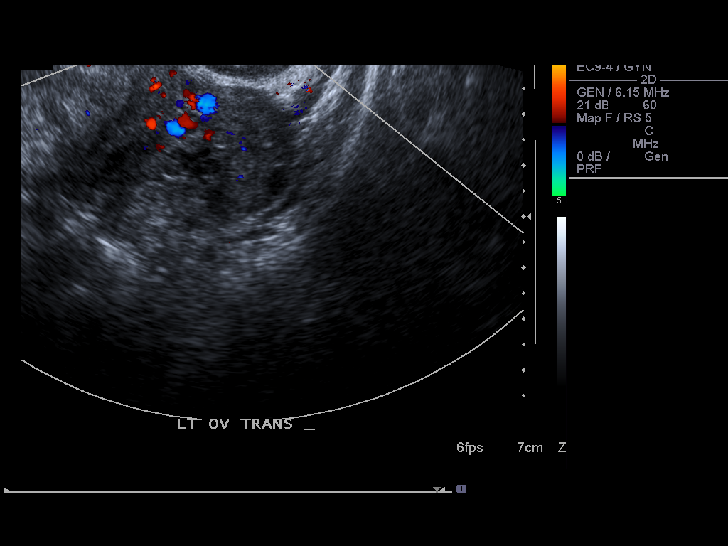
[im 83/91]
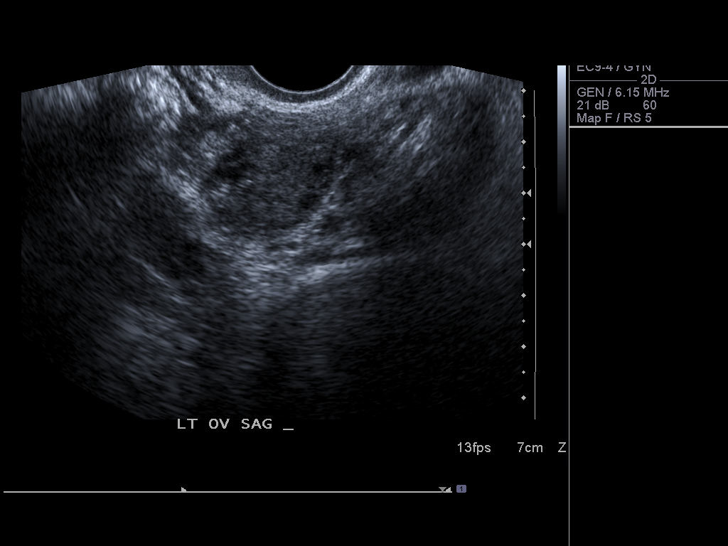
[im 91/91]
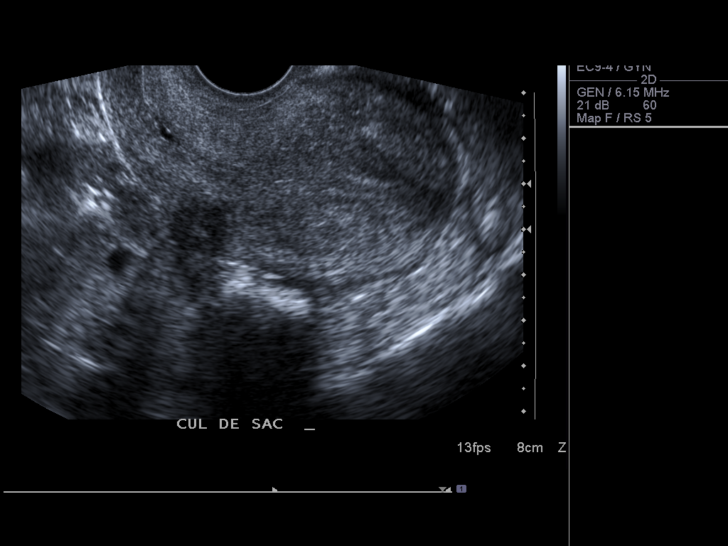

[14 of 25 positions shown; findings below may reference images not displayed]

FINDINGS: Uterus: Measures 8.7 x 4.9 x 5.3 cm.  Small left sided myometrial
fibroids are noted.  These measure less and 2 cm.

Endometrium: Normal in thickness measuring a maximum of 2.7 mm.

Right ovary:  Measures 4.1 x 2.2 x 3.1 cm.  A 2.3 x 1.8 x 1.7 cm
probable hemorrhagic cyst is noted.

Left ovary: Measures 3.1 x 1.8 by a 2.4 cm.  No cysts or masses.

Other findings: A small amount of free pelvic fluid is noted.
IMPRESSION: 1.  Small myometrial fibroids.
2.  Normal endometrium.
3.  2.3 x 1.8 x 1.7 cm complex cyst associated with the right
ovary.  This is likely a hemorrhagic cyst.
4.  Small amount of free pelvic fluid..

## 2015-12-22 NOTE — Nursing Note (Signed)
Nursing Discharge Summary - Text       Nursing Discharge Summary Entered On:  12/22/2015 13:52 EDT    Performed On:  12/22/2015 13:52 EDT by Malvin JohnsPOTTER, RN, ASHLEIGH B               DC Information   Discharge To, Anticipated :   Home with family support   Mode of Discharge :   Ambulatory   Transportation :   Private vehicle   Accompanied By :   Significant other   POTTER, RN, Cameron SprangASHLEIGH B - 12/22/2015 13:52 EDT   Education   Responsible Learner(s) :   No Data Available     Barriers To Learning :   None evident   Teaching Method :   Explanation, Printed materials   POTTER, RN, Cameron SprangSHLEIGH B - 12/22/2015 13:52 EDT   Post-Hospital Education Adult Grid   Importance of Follow-Up Visits :   Verbalizes understanding   Plan of Care :   Verbalizes understanding   When to Call Health Care Provider :   Trenton GammonVerbalizes understanding   POTTER, RN, Cameron SprangASHLEIGH B - 12/22/2015 13:52 EDT   Medication Education Adult Grid   Med Dosage, Route, Scheduling :   TEFL teacherVerbalizes understanding   Med Generic/Brand Name, Purpose, Action :   Verbalizes understanding   Med Preadministration Procedures :   [employer]ers SquibbVerbalizes understanding   Med Teacher, musicpecial Administration, Storage :   [employer]-Myers SquibbVerbalizes understanding   Medication Precautions :   Verbalizes understanding   POTTER, RN, ASHLEIGH B - 12/22/2015 13:52 EDT   Education Referral Made To :   Other: obgyn   Additional Learner(s) Present :   Significant other   Time Spent Educating Patient :   5 minutes   POTTER, RN, ASHLEIGH B - 12/22/2015 13:52 EDT

## 2018-05-01 HISTORY — PX: COLPOSCOPY: SHX161

## 2018-05-06 NOTE — ED Notes (Signed)
ED Triage Note       ED Secondary Triage Entered On:  05/06/2018 16:31 EST    Performed On:  05/06/2018 16:31 EST by Fanny Dance, RN, BRITTNAY               General Information   Barriers to Learning :   None evident   ED Home Meds Section :   Document assessment   [facility] ED Fall Risk Section :   Document assessment   Infectious Disease Documentation :   Document assessment   ED Advance Directives Section :   Document assessment   Fanny Dance RN, BRITTNAY - 05/06/2018 16:31 EST   (As Of: 05/06/2018 16:31:30 EST)   Problems(Active)    Fibroids (IMO  :[id number] )  Name of Problem:   Fibroids ; Recorder:   Willette Cluster; Confirmation:   Confirmed ; Classification:   Medical ; Code:   893734 ; Contributor System:   Dietitian ; Last Updated:   06/21/2016 19:37 EST ; Life Cycle Date:   06/21/2016 ; Life Cycle Status:   Active ; Vocabulary:   IMO          Diagnoses(Active)    Arm pain-swelling  Date:   05/06/2018 ; Diagnosis Type:   Reason For Visit ; Confirmation:   Complaint of ; Clinical Dx:   Arm pain-swelling ; Classification:   Medical ; Clinical Service:   Emergency medicine ; Code:   PNED ; Probability:   0 ; Diagnosis Code:   [id number]             -    Procedure History   (As Of: 05/06/2018 16:31:30 EST)     [facility] Perch Fall Risk Assessment Tool   Hx of falling last 3 months ED Fall :   No   Fanny Dance, RN, BRITTNAY - 05/06/2018 16:31 EST   ED Advance Directive   Advance Directive :   No   Fanny Dance, RN, BRITTNAY - 05/06/2018 16:31 EST   ID Risk Screen Symptoms   Recent Travel History :   No recent travel   TB Symptom Screen :   No symptoms   C. diff Symptom/History ID :   Neither of the above   VELASQUEZ, RN, BRITTNAY - 05/06/2018 16:31 EST   Med Hx   Medication List   (As Of: 05/06/2018 16:31:30 EST)   Prescription/Discharge Order    Misc Medication  :   Misc Medication ; Status:   Prescribed ; Ordered As Mnemonic:   Magic Mouthwash ; Simple Display Line:   See Instructions, 1 part Viscous Lidocaine 2%; 1  part Maalox; 1 part diphenhydramine 12.5mg /52mL. Swish and spit., 200 mL, 0 Refill(s) ; Ordering Provider:   Windy Carina E-MD; Catalog Code:   Misc Medication ; Order Dt/Tm:   05/15/2017 17:58:17 EST            Home Meds    Misc Rx Supply  :   Misc Rx Supply ; Status:   Documented ; Ordered As Mnemonic:   Blind Study medication ; Simple Display Line:   See Instructions, [place], 0 Refill(s) ; Catalog Code:   Misc Rx Supply ; Order Dt/Tm:   05/15/2017 17:03:21 EST

## 2018-05-06 NOTE — ED Notes (Signed)
ED Triage Note       ED Triage Adult Entered On:  05/06/2018 16:31 EST    Performed On:  05/06/2018 16:28 EST by Fanny Dance, RN, BRITTNAY               Triage   Chief Complaint :   PT C/O L UPPER ARM PAIN SINCE 04/05/18. 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 PT REPORTS NO RELIEF WITH IBUPROFEN AND BIOFREEZE   Numeric Rating Pain Scale :   6   ED Pain Details :   Pain Details   [place] Mode of Arrival :   Walking   Temperature Oral :   37 degC(Converted to: 98.6 degF)    Heart Rate Monitored :   83 bpm   Respiratory Rate :   18 br/min   Systolic Blood Pressure :   145 mmHg (HI)    Diastolic Blood Pressure :   84 mmHg   SpO2 :   100 %   Oxygen Therapy :   Room air   Patient presentation :   None of the above   Chief Complaint or Presentation suggest infection :   No   Weight Dosing :   65 kg(Converted to: 143 lb 5 oz)    Height :   160 cm(Converted to: 5 ft 3 in)    Body Mass Index Dosing :   25 kg/m2   ED General Section :   Document assessment   Pregnancy Status :   Patient denies   Fanny Dance RN, BRITTNAY - 05/06/2018 16:28 EST   DCP GENERIC CODE   Tracking Acuity :   4   Tracking Group :   ED [employer]   [place], RN, BRITTNAY - 05/06/2018 16:28 EST   ED Allergies Section :   Document assessment   ED Reason for Visit Section :   Document assessment   Fanny Dance RN, BRITTNAY - 05/06/2018 16:28 EST   Allergies   (As Of: 05/06/2018 16:31:07 EST)   Allergies (Active)   No Known Allergies  Estimated Onset Date:   Unspecified ; Created ByZadie Rhine, RN, KRISTY M; Reaction Status:   Active ; Category:   Drug ; Substance:   No Known Allergies ; Type:   Allergy ; Updated By:   Elder Love; Reviewed Date:   05/15/2017 17:33 EST        Pain Assessment   Laterality :   Left   Pain Location :   Arm   VELASQUEZ, RN, BRITTNAY - 05/06/2018 16:28 EST   Image 4 -  Images currently included in the form version of this document have not been included in the text rendition version of the form.   Psycho-Social   Last 3 mo,  thoughts killing self/others :   Patient denies   Jenene Slicker - 05/06/2018 16:28 EST   ED Reason for Visit   (As Of: 05/06/2018 16:31:07 EST)   Problems(Active)    Fibroids (IMO  :[id number] )  Name of Problem:   Fibroids ; Recorder:   Willette Cluster; Confirmation:   Confirmed ; Classification:   Medical ; Code:   948016 ; Contributor System:   Dietitian ; Last Updated:   06/21/2016 19:37 EST ; Life Cycle Date:   06/21/2016 ; Life Cycle Status:   Active ; Vocabulary:   IMO          Diagnoses(Active)    Arm pain-swelling  Date:   05/06/2018 ;  Diagnosis Type:   Reason For Visit ; Confirmation:   Complaint of ; Clinical Dx:   Arm pain-swelling ; Classification:   Medical ; Clinical Service:   Emergency medicine ; Code:   PNED ; Probability:   0 ; Diagnosis Code:   [id number]

## 2018-05-06 NOTE — Discharge Summary (Signed)
 ED Clinical Summary                        [facility]  [address]  [place], [place] [address]  [phone]           PERSON INFORMATION  Name: Molly Ward, Molly Ward Age:  43 Years DOB: 25-Jan-1976   Sex: Female Language: English PCP: Molly Ward   Marital Status: Single Phone: [phone] Med Service: MED-Medicine   MRN: [id number] Acct# [id number] Arrival: 05/06/2018 16:25:00   Visit Reason: Arm pain-swelling; LEFT ARM PAIN Acuity: 4 LOS: 000 00:25   Address:    [address] [place] [address]   Diagnosis:    Radicular pain in left arm  Medications:          New Medications  Printed Prescriptions  lidocaine topical (lidocaine 5% topical film) 1 Patches Topical (on the skin) every day. Refills: 0.  Last Dose:____________________  methocarbamol (Robaxin-750 oral tablet) 2 Tabs Oral (given by mouth) 3 times Ward day as needed as needed for pain for 7 Days. Refills: 0.  Last Dose:____________________  predniSONE (predniSONE 50 mg oral tablet) 1 Tabs Oral (given by mouth) every day for 5 Days. Refills: 0.  Last Dose:____________________  Medications that have not changed  Other Medications  Misc Medication (Magic Mouthwash) 1 part Viscous Lidocaine 2%; 1 part Maalox; 1 part diphenhydramine 12.5mg /77mL. Swish and spit.Molly Ward Refills: 0.  Last Dose:____________________  Misc Rx Supply (Blind Study medication) [facility].  Last Dose:____________________      Medications Administered During Visit:              Allergies      No Known Allergies      Major Tests and Procedures:  The following procedures and tests were performed during your ED visit.  COMMON PROCEDURES%>  COMMON PROCEDURES COMMENTS%>                PROVIDER INFORMATION               Provider Role Assigned Molly Ward ED MidLevel 05/06/2018 16:34:27    Molly Ward ED Nurse 05/06/2018 16:45:37        Attending Physician:  DEFAULT,  Ward      Admit Doc  DEFAULT,  Ward     Consulting Doc       VITALS  INFORMATION  Vital Sign Triage Latest   Temp Oral ORAL_1%> ORAL%>   Temp Temporal TEMPORAL_1%> TEMPORAL%>   Temp Intravascular INTRAVASCULAR_1%> INTRAVASCULAR%>   Temp Axillary AXILLARY_1%> AXILLARY%>   Temp Rectal RECTAL_1%> RECTAL%>   02 Sat 100 % 100 %   Respiratory Rate RATE_1%> RATE%>   Peripheral Pulse Rate PULSE RATE_1%> PULSE RATE%>   Apical Heart Rate HEART RATE_1%> HEART RATE%>   Blood Pressure BLOOD PRESSURE_1%>/ BLOOD PRESSURE_1%>84 mmHg BLOOD PRESSURE%> / BLOOD PRESSURE%>84 mmHg                 Immunizations      No Immunizations Documented This Visit          DISCHARGE INFORMATION   Discharge Disposition: H Outpt-Sent Home   Discharge Location:  Home   Discharge Date and Time:  05/06/2018 16:50:00   ED Checkout Date and Time:  05/06/2018 16:50:00     DEPART REASON INCOMPLETE INFORMATION               Depart Action Incomplete Reason   Interactive View/I&O Recently  assessed               Problems      Active           Fibroids              Smoking Status      Current some day smoker         PATIENT EDUCATION INFORMATION  Instructions:     Cervical Radiculopathy, Easy-to-Read     Follow up:                   With: Address: When:   PCP or clinic  Within 1 week, only if needed   Comments:   Return to ED if symptoms worsen. Please follow-up with regular Ward if not improving.  If you do not have Ward regular Ward you may call [phone] or see attached clinic information. Take steroids in the morning because they can make it difficult to sleep.  Avoid taking with NSAIDs like ibuprofen because this can irritate your stomach. You may take over-the-counter pain relievers ibuprofen and Tylenol as needed.  You may take ibuprofen up to MAX DOSES of 800 mg every 8 hours and Tylenol up to 1000 mg every 6 hours. Take muscle relaxer at night for the first time because it can make you drowsy.  Do not take and drive or operate machinery or take with alcohol due to sedation effects.  Do not take with narcotics or other  sedatives.              ED PROVIDER DOCUMENTATION     Patient:   Molly Ward             MRN: [id number]            FIN: [id number]               Age:   43 years     Sex:  Female     DOB:  12-29-1975   Associated Diagnoses:   Radicular pain in left arm   Author:   STACIE SINCLAIR Ward      Basic Information   Time seen: Provider Seen (ST)   ED Provider/Time:    RAMSEY-PAC,  MARIAH Ward / 05/06/2018 16:34  .   Additional information: Chief Complaint from Nursing Triage Note   Chief Complaint  Chief Complaint: PT C/O L UPPER ARM PAIN SINCE 04/05/18. 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 PT REPORTS NO RELIEF WITH IBUPROFEN AND BIOFREEZE (05/06/18 16:28:00).      History of Present Illness   43 year old female presenting to the ER with left arm pain.  Symptoms have been ongoing for 1 month.  Coming and going.  Radiates from her left shoulder down the lateral side of her arm to her elbow.  Described as throbbing, worse with movement, worse at night.  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  Taking Motrin, BC powder, Biofreeze with improvement temporarily.  Never dealt with this before.  Denies numbness, tingling, trauma..        Review of Systems   Constitutional symptoms:  No fever, no chills, no fatigue.    Skin symptoms:  No jaundice, no rash.    Eye symptoms:  No pain, no discharge.    ENMT symptoms:  No sore throat, no nasal congestion.    Respiratory symptoms:  No shortness of breath, no cough.  Cardiovascular symptoms:  No chest pain, no peripheral edema.    Gastrointestinal symptoms:  No abdominal pain, no nausea, no vomiting, no diarrhea.    Genitourinary symptoms:  No dysuria, no vaginal discharge.    Musculoskeletal symptoms:  Left arm pain., No back pain,    Neurologic symptoms:  No headache, no dizziness.              Additional review of systems information: All systems reviewed as documented in chart.      Health Status   Allergies:    Allergic Reactions  (All)  No Known Allergies.   Medications:  (Selected)   Prescriptions  Prescribed  Magic Mouthwash: See Instructions, 1 part Viscous Lidocaine 2%; 1 part Maalox; 1 part diphenhydramine 12.5mg /48mL. Swish and spit., 200 mL, 0 Refill(s)  Documented Medications  Documented  Blind Study medication: See Instructions, [facility], 0 Refill(s).      Past Medical/ Family/ Social History   Surgical history: Reviewed as documented in chart.   Family history: Reviewed as documented in chart.   Social history: Reviewed as documented in chart.   Problem list: Per nurse's notes.      Physical Examination               Vital Signs   Vital Signs   05/06/2018 16:28 EST Systolic Blood Pressure 145 mmHg  HI    Diastolic Blood Pressure 84 mmHg    Temperature Oral 37 degC    Heart Rate Monitored 83 bpm    Respiratory Rate 18 br/min    SpO2 100 %    Measurements   05/06/2018 16:31 EST Body Mass Index est meas 25.39 kg/m2    Body Mass Index Measured 25.39 kg/m2   05/06/2018 16:28 EST Height/Length Measured 160 cm    Weight Dosing 65 kg    General:  Alert, no acute distress, Not ill-appearing,    Skin:  Warm, dry.    Head:  Normocephalic.   Neck:  Supple.   Eye:  Normal conjunctiva, vision grossly normal.    Cardiovascular:  Regular rate and rhythm, No murmur.    Respiratory:  Lungs are clear to auscultation, respirations are non-labored.    Chest wall:  No deformity.   Back:  Normal range of motion.   Musculoskeletal:  Normal ROM, normal strength, Left upper trapezius tender to palpation.  No midline tenderness to palpation of spine and no deformities noted.  Left upper lateral extremity tender to palpation.  No bony tenderness.  Radial pulse 2+.  Strength 5 out of 5.  Sensation intact..    Neurological:  Normal motor observed, normal speech observed.    Psychiatric:  Appropriate mood & affect.      Medical Decision Making   Notes:  HDS, NAD, afebrile.  Physical exam as above with Left upper trapezius tender to palpation.  No midline  tenderness to palpation of spine and no deformities noted.  Left upper lateral extremity tender to palpation.  No bony tenderness.  Radial pulse 2+.  Strength 5 out of 5.  Sensation intact.  History and physical consistent with radicular pain of left arm.  Will send home on prednisone burst, Robaxin, lidocaine patches and follow-up with regular Ward if not improving..      Impression and Plan   Diagnosis   Radicular pain in left arm (ICD10-CM M79.2, Discharge, Medical)   Plan   Condition: Stable.    Disposition: Discharged: Time  05/06/2018 16:47:00, to home.    Prescriptions: Launch  prescriptions   Pharmacy:  lidocaine 5% topical film (Prescribe): 1 patches, Topical, Daily, 30 patches, 0 Refill(s)  Robaxin-750 oral tablet (Prescribe): 1,500 mg, 2 tabs, Oral, TID, for 7 days, PRN: as needed for pain, 30 tabs, 0 Refill(s)  predniSONE 50 mg oral tablet (Prescribe): 50 mg, 1 tabs, Oral, Daily, for 5 days, 5 tabs, 0 Refill(s).    Patient was given the following educational materials: Cervical Radiculopathy, Easy-to-Read.    Follow up with: PCP or clinic Within 1 week, only if needed Return to ED if symptoms worsen.  Please follow-up with regular Ward if not improving. If you do not have Ward regular Ward you maycall [phone] see attached clinic information. Take steroids in the morning because they canmake it difficult to sleep. Avoid taking with NSAIDs like ibuprofen because this can irritate your stomach.  You may take over-the-counter pain relievers ibuprofen and Tylenol as needed.You may take ibuprofen up to MAX DOSES of 800 mg every 8 hours and Tylenol up to 1000 mg every 6 hours.  Take muscle relaxer at night for the first time because it can make you drowsy. Do not take and drive or operate machinery or take with alcohol due to sedation effects. Do not take with narcotics or other sedatives.  .    Counseled: I had Ward detailed discussion with the patient and/or guardian regarding the  historical points/exam findings supporting the discharge diagnosis and need for outpatient followup. Discussed the need to return to the ER if symptoms persist/worsen, or for any questions/concerns that arise at home.

## 2018-05-06 NOTE — ED Notes (Signed)
ED Patient Education Note     Patient Education Materials Follows:  Musculoskeletal     Cervical Radiculopathy    Cervical radiculopathy means that a nerve in the neck is pinched or bruised. This can cause pain or loss of feeling (numbness) that runs from your neck to your arm and fingers.      HOME CARE    Managing Pain     Take over-the-counter and prescription medicines only as told by your doctor.     If directed, put ice on the injured or painful area.    ? Put ice in a plastic bag.    ? Place a towel between your skin and the bag.    ? Leave the ice on for 20 minutes, 2?3 times per day.     If ice does not help, you can try using heat. Take a warm shower or warm bath, or use a heat pack as told by your doctor.     You may try a gentle neck and shoulder massage.    Activity     Rest as needed. Follow instructions from your doctor about any activities to avoid.     Do exercises as told by your doctor or physical therapist.    General Instructions?      If you were given a soft collar, wear it as told by your doctor.      Use a flat pillow when you sleep.     Keep all follow-up visits as told by your doctor. This is important.    GET HELP IF:     Your condition does not improve with treatment.    GET HELP RIGHT AWAY IF:     Your pain gets worse and is not controlled with medicine.     You lose feeling or feel weak in your hand, arm, face, or leg.     You have a fever.     You have a stiff neck.     You cannot control when you poop or pee (have incontinence).     You have trouble with walking, balance, or talking.    This information is not intended to replace advice given to you by your health care provider. Make sure you discuss any questions you have with your health care provider.    Document Released: 04/06/2011 Document Revised: 01/06/2015 Document Reviewed: 06/11/2014  Elsevier Interactive Patient Education ?2016 Elsevier Inc.

## 2018-05-06 NOTE — ED Notes (Signed)
 ED Patient Summary       ;      [facility] Emergency Department  [address] [place], [place] [address]  [id number]  Discharge Instructions (Patient)  _______________________________________    Name: Molly Ward, Molly Ward  DOB: [birth year] MRN: [id number] FIN: [id number]  Reason For Visit: Arm pain-swelling; LEFT ARM PAIN  Final Diagnosis: Radicular pain in left arm    Visit Date: 05/06/2018 16:25:00  Address: [address] [place] [address]  Phone: [phone]    Primary Care Provider:  Name: HORACIO LAURAINE BRAVO  Phone: [phone]     Emergency Department Providers:         Primary Physician:           [facility] would like to thank you for allowing us  to assist you with your healthcare needs. The following includes patient education materials and information regarding your injury/illness.    Follow-up Instructions: You were treated today on an emergency basis, it may be wise to contact your primary care provider to notify them of your visit today. You may have been referred to your regular doctor or a specialist, please follow up as instructed. If your condition worsens or you can't get in to see the doctor, contact the Emergency Department.              With: Address: When:   PCP or clinic  Within 1 week, only if needed   Comments:   Return to ED if symptoms worsen. Please follow-up with regular doctor if not improving.  If you do not have a regular doctor you may call [phone] or see attached clinic information. Take steroids in the morning because they can make it difficult to sleep.  Avoid taking with NSAIDs like ibuprofen because this can irritate your stomach. You may take over-the-counter pain relievers ibuprofen and Tylenol as needed.  You may take ibuprofen up to MAX DOSES of 800 mg every 8 hours and Tylenol up to 1000 mg every 6 hours. Take muscle relaxer at night for the first time because it can make you drowsy.  Do not take and drive or operate machinery or take with alcohol  due to sedation effects.  Do not take with narcotics or other sedatives.             Patient Education Materials:  Cervical Radiculopathy, Easy-to-Read     Cervical Radiculopathy    Cervical radiculopathy means that a nerve in the neck is pinched or bruised. This can cause pain or loss of feeling (numbness) that runs from your neck to your arm and fingers.      HOME CARE    Managing Pain     Take over-the-counter and prescription medicines only as told by your doctor.     If directed, put ice on the injured or painful area.    ? Put ice in a plastic bag.    ? Place a towel between your skin and the bag.    ? Leave the ice on for 20 minutes, 2?3 times per day.     If ice does not help, you can try using heat. Take a warm shower or warm bath, or use a heat pack as told by your doctor.     You may try a gentle neck and shoulder massage.    Activity     Rest as needed. Follow instructions from your doctor about any activities to avoid.     Do exercises as told by  your doctor or physical therapist.    General Instructions?      If you were given a soft collar, wear it as told by your doctor.      Use a flat pillow when you sleep.     Keep all follow-up visits as told by your doctor. This is important.    GET HELP IF:     Your condition does not improve with treatment.    GET HELP RIGHT AWAY IF:     Your pain gets worse and is not controlled with medicine.     You lose feeling or feel weak in your hand, arm, face, or leg.     You have a fever.     You have a stiff neck.     You cannot control when you poop or pee (have incontinence).     You have trouble with walking, balance, or talking.    This information is not intended to replace advice given to you by your health care provider. Make sure you discuss any questions you have with your health care provider.    Document Released: 04/06/2011 Document Revised: 01/06/2015 Document Reviewed: 06/11/2014  Elsevier Interactive Patient Education ?2016 Elsevier Inc.         Allergy Info: No Known Allergies    Medication Information:  [facility] ED Physicians provided you with a complete list of medications post discharge, if you have been instructed to stop taking a medication please ensure you also follow up with this information to your Primary Care Physician. Unless otherwise noted, patient will continue to take medications as prescribed prior to the Emergency Room visit. Any specific questions regarding your chronic medications and dosages should be discussed with your physician(s) and pharmacist.          New Medications  Printed Prescriptions  lidocaine topical (lidocaine 5% topical film) 1 Patches Topical (on the skin) every day. Refills: 0.  Last Dose:____________________  methocarbamol (Robaxin-750 oral tablet) 2 Tabs Oral (given by mouth) 3 times a day as needed as needed for pain for 7 Days. Refills: 0.  Last Dose:____________________  predniSONE (predniSONE 50 mg oral tablet) 1 Tabs Oral (given by mouth) every day for 5 Days. Refills: 0.  Last Dose:____________________  Medications that have not changed  Other Medications  Misc Medication (Magic Mouthwash) 1 part Viscous Lidocaine 2%; 1 part Maalox; 1 part diphenhydramine 12.5mg /19mL. Swish and spit.SABRA Refills: 0.  Last Dose:____________________  Misc Rx Supply (Blind Study medication) [facility].  Last Dose:____________________      Medications Administered During Visit:      Major Tests and Procedures:  The following procedures and tests were performed during your ED visit.  PROCEDURES%>  PROCEDURES COMMENTS%>          ---------------------------------------------------------------------------------------------------------------------  [facility] allows you to manage your health, view your test results, and retrieve your discharge documents from your hospital stay securely and conveniently from your computer.    To begin the enrollment process, visit [email or web address] Click on "Sign up now" under  [facility].      Comment:

## 2018-05-06 NOTE — ED Provider Notes (Signed)
Arm pain-swelling        Patient:   Molly Ward, Molly Ward             MRN: [id number]            FIN: [id number]               Age:   43 years     Sex:  Female     DOB:  13-Mar-1976   Associated Diagnoses:   Radicular pain in left arm   Author:   Fleet Contras K      Basic Information   Time seen: Provider Seen (ST)   ED Provider/Time:    RAMSEY-PAC,  MARIAH K / 05/06/2018 16:34  .   Additional information: Chief Complaint from Nursing Triage Note   Chief Complaint  Chief Complaint: PT C/O L UPPER ARM PAIN SINCE 04/05/18. 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 PT REPORTS NO RELIEF WITH IBUPROFEN AND BIOFREEZE (05/06/18 16:28:00).      History of Present Illness   43 year old female presenting to the ER with left arm pain.  Symptoms have been ongoing for 1 month.  Coming and going.  Radiates from her left shoulder down the lateral side of her arm to her elbow.  Described as throbbing, worse with movement, worse at night.  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  Taking Motrin, BC powder, Biofreeze with improvement temporarily.  Never dealt with this before.  Denies numbness, tingling, trauma..        Review of Systems   Constitutional symptoms:  No fever, no chills, no fatigue.    Skin symptoms:  No jaundice, no rash.    Eye symptoms:  No pain, no discharge.    ENMT symptoms:  No sore throat, no nasal congestion.    Respiratory symptoms:  No shortness of breath, no cough.    Cardiovascular symptoms:  No chest pain, no peripheral edema.    Gastrointestinal symptoms:  No abdominal pain, no nausea, no vomiting, no diarrhea.    Genitourinary symptoms:  No dysuria, no vaginal discharge.    Musculoskeletal symptoms:  Left arm pain., No back pain,    Neurologic symptoms:  No headache, no dizziness.              Additional review of systems information: All systems reviewed as documented in chart.      Health Status   Allergies:    Allergic Reactions (All)  No Known Allergies.    Medications:  (Selected)   Prescriptions  Prescribed  Magic Mouthwash: See Instructions, 1 part Viscous Lidocaine 2%; 1 part Maalox; 1 part diphenhydramine 12.5mg /71mL. Swish and spit., 200 mL, 0 Refill(s)  Documented Medications  Documented  Blind Study medication: See Instructions, [facility], 0 Refill(s).      Past Medical/ Family/ Social History   Surgical history: Reviewed as documented in chart.   Family history: Reviewed as documented in chart.   Social history: Reviewed as documented in chart.   Problem list: Per nurse's notes.      Physical Examination               Vital Signs   Vital Signs   05/06/2018 16:28 EST Systolic Blood Pressure 145 mmHg  HI    Diastolic Blood Pressure 84 mmHg    Temperature Oral 37 degC    Heart Rate Monitored 83 bpm    Respiratory Rate 18 br/min  SpO2 100 %    Measurements   05/06/2018 16:31 EST Body Mass Index est meas 25.39 kg/m2    Body Mass Index Measured 25.39 kg/m2   05/06/2018 16:28 EST Height/Length Measured 160 cm    Weight Dosing 65 kg    General:  Alert, no acute distress, Not ill-appearing,    Skin:  Warm, dry.    Head:  Normocephalic.   Neck:  Supple.   Eye:  Normal conjunctiva, vision grossly normal.    Cardiovascular:  Regular rate and rhythm, No murmur.    Respiratory:  Lungs are clear to auscultation, respirations are non-labored.    Chest wall:  No deformity.   Back:  Normal range of motion.   Musculoskeletal:  Normal ROM, normal strength, Left upper trapezius tender to palpation.  No midline tenderness to palpation of spine and no deformities noted.  Left upper lateral extremity tender to palpation.  No bony tenderness.  Radial pulse 2+.  Strength 5 out of 5.  Sensation intact..    Neurological:  Normal motor observed, normal speech observed.    Psychiatric:  Appropriate mood & affect.      Medical Decision Making   Notes:  HDS, NAD, afebrile.  Physical exam as above with Left upper trapezius tender to palpation.  No midline tenderness to palpation of  spine and no deformities noted.  Left upper lateral extremity tender to palpation.  No bony tenderness.  Radial pulse 2+.  Strength 5 out of 5.  Sensation intact.  History and physical consistent with radicular pain of left arm.  Will send home on prednisone burst, Robaxin, lidocaine patches and follow-up with regular doctor if not improving..      Impression and Plan   Diagnosis   Radicular pain in left arm (ICD10-CM M79.2, Discharge, Medical)   Plan   Condition: Stable.    Disposition: Discharged: Time  05/06/2018 16:47:00, to home.    Prescriptions: Launch prescriptions   Pharmacy:  lidocaine 5% topical film (Prescribe): 1 patches, Topical, Daily, 30 patches, 0 Refill(s)  Robaxin-750 oral tablet (Prescribe): 1,500 mg, 2 tabs, Oral, TID, for 7 days, PRN: as needed for pain, 30 tabs, 0 Refill(s)  predniSONE 50 mg oral tablet (Prescribe): 50 mg, 1 tabs, Oral, Daily, for 5 days, 5 tabs, 0 Refill(s).    Patient was given the following educational materials: Cervical Radiculopathy, Easy-to-Read.    Follow up with: PCP or clinic Within 1 week, only if needed Return to ED if symptoms worsen.  Please follow-up with regular doctor if not improving.???? If you do not have a regular doctor you may????call [phone]????or see attached clinic information. Take steroids in the morning because they can????make it difficult to sleep. ????Avoid taking with NSAIDs like ibuprofen because this can irritate your stomach.  You may take over-the-counter pain relievers ibuprofen and Tylenol as needed.????????You may take ibuprofen up to MAX DOSES of 800 mg every 8 hours and Tylenol up to 1000 mg every 6 hours.  Take muscle relaxer at night for the first time because it can make you drowsy. ????Do not take and drive or operate machinery or take with alcohol due to sedation effects.???? Do not take with narcotics or other sedatives.  .    Counseled: I had a detailed discussion with the patient and/or guardian regarding the historical points/exam findings  supporting the discharge diagnosis and need for outpatient followup. Discussed the need to return to the ER if symptoms persist/worsen, or for any questions/concerns that arise at home.  Addendum      Teaching-Supervisory Addendum-Brief   I participated in the following activities of this patients care: the medical history, medical decision making.   I personally performed: supervision of the patient's care, the medical history, the medical decision making.   Evaluation and management service: I agree with the evaluation and management decisions made in this patient's care.   Results interpretation: I agree with the study interpretation in this patient's care, I agree with the documentation of the study interpretation.   Signature Line     Electronically Signed on 05/06/2018 04:50 PM EST   ________________________________________________   Fleet Contras K      Electronically Signed on 05/06/2018 05:47 PM EST   ________________________________________________   Tonny Branch            Modified by: Tonny Branch on 05/06/2018 05:47 PM EST
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
## 2018-06-27 NOTE — ED Notes (Signed)
ED Triage Note       ED Secondary Triage Entered On:  06/27/2018 19:29 EST    Performed On:  06/27/2018 19:29 EST by Wallace Keller, RN, Meagan               General Information   Barriers to Learning :   None evident   ED Home Meds Section :   Document assessment   [facility] ED Fall Risk Section :   Document assessment   Infectious Disease Documentation :   Document assessment   ED Advance Directives Section :   Document assessment   Anoceto, RN, Meagan - 06/27/2018 19:29 EST   (As Of: 06/27/2018 19:29:53 EST)   Problems(Active)    Fibroids (IMO  :970263 )  Name of Problem:   Fibroids ; Recorder:   Willette Cluster; Confirmation:   Confirmed ; Classification:   Medical ; Code:   785885 ; Contributor System:   Dietitian ; Last Updated:   06/21/2016 19:37 EST ; Life Cycle Date:   06/21/2016 ; Life Cycle Status:   Active ; Vocabulary:   IMO          Diagnoses(Active)    Arm injury - pain left upper arm and shoulder  Date:   06/27/2018 ; Diagnosis Type:   Reason For Visit ; Confirmation:   Complaint of ; Clinical Dx:   Arm injury - pain left upper arm and shoulder ; Classification:   Medical ; Clinical Service:   Emergency medicine ; Code:   PNED ; Probability:   0 ; Diagnosis Code:   [id number]             -    Procedure History   (As Of: 06/27/2018 19:29:54 EST)     [facility]ch Fall Risk Assessment Tool   Hx of falling last 3 months ED Fall :   No   Patient confused or disoriented ED Fall :   No   Patient intoxicated or sedated ED Fall :   No   Patient impaired gait ED Fall :   No   Use a mobility assistance device ED Fall :   No   Patient altered elimination ED Fall :   No   [facility] ED Fall Score :   0    Anoceto, RN, Meagan - 06/27/2018 19:29 EST   ED Advance Directive   Advance Directive :   No   Anoceto, RN, Meagan - 06/27/2018 19:29 EST   ID Risk Screen Symptoms   Recent Travel History :   No recent travel   TB Symptom Screen :   No symptoms   C. diff Symptom/History ID :   Neither of the above    Anoceto, RN, Meagan - 06/27/2018 19:29 EST   Med Hx   Medication List   (As Of: 06/27/2018 19:29:54 EST)   Prescription/Discharge Order    lidocaine topical  :   lidocaine topical ; Status:   Prescribed ; Ordered As Mnemonic:   lidocaine 5% topical film ; Simple Display Line:   1 patches, Topical, Daily, 30 patches, 0 Refill(s) ; Ordering Provider:   Fleet Contras K; Catalog Code:   lidocaine topical ; Order Dt/Tm:   05/06/2018 16:47:46 EST          Misc Medication  :   Misc Medication ; Status:   Prescribed ; Ordered As Mnemonic:   Magic Mouthwash ; Simple Display Line:   See Instructions, 1 part Viscous Lidocaine 2%; 1 part Maalox; 1  part diphenhydramine 12.5mg /4mL. Swish and spit., 200 mL, 0 Refill(s) ; Ordering Provider:   Kerrin Champagne; Catalog Code:   Misc Medication ; Order Dt/Tm:   05/15/2017 17:58:17 EST            Home Meds    Misc Rx Supply  :   Misc Rx Supply ; Status:   Documented ; Ordered As Mnemonic:   Blind Study medication ; Simple Display Line:   See Instructions, [facility], 0 Refill(s) ; Catalog Code:   Misc Rx Supply ; Order Dt/Tm:   05/15/2017 17:03:21 EST
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
## 2018-12-16 LAB — CBC WITH AUTO DIFFERENTIAL
Basophils %: 1 % (ref 0.0–2.0)
Basophils Absolute: 0.1 10*3/uL (ref 0.0–0.2)
Eosinophils %: 2.6 % (ref 0.0–7.0)
Eosinophils Absolute: 0.2 10*3/uL (ref 0.0–0.5)
Hematocrit: 35.2 % (ref 34.0–47.0)
Hemoglobin: 10.5 g/dL — ABNORMAL LOW (ref 11.5–15.7)
Immature Grans (Abs): 0.03 10*3/uL (ref 0.00–0.06)
Immature Granulocytes %: 0.3 % (ref 0.1–0.6)
Lymphocytes Absolute: 2.7 10*3/uL (ref 1.0–3.2)
Lymphocytes: 29.4 % (ref 15.0–45.0)
MCH: 20.5 pg — ABNORMAL LOW (ref 27.0–34.5)
MCHC: 29.8 g/dL — ABNORMAL LOW (ref 32.0–36.0)
MCV: 68.6 fL — ABNORMAL LOW (ref 81.0–99.0)
MPV: 11.4 fL (ref 7.2–13.2)
Monocytes %: 4.8 % (ref 4.0–12.0)
Monocytes Absolute: 0.5 10*3/uL (ref 0.3–1.0)
NRBC Absolute: 0 10*3/uL (ref 0.000–0.012)
NRBC Automated: 0 % (ref 0.0–0.2)
Neutrophils %: 61.9 % (ref 42.0–74.0)
Neutrophils Absolute: 5.7 10*3/uL (ref 1.6–7.3)
Platelets: 235 10*3/uL (ref 140–440)
RBC: 5.13 x10e6/mcL (ref 3.60–5.20)
RDW: 16.3 % — ABNORMAL HIGH (ref 11.0–16.0)
WBC: 9.3 10*3/uL (ref 3.8–10.6)

## 2018-12-16 LAB — COMPREHENSIVE METABOLIC PANEL
ALT: 11 U/L (ref 0–33)
AST: 15 U/L (ref 0–32)
Albumin/Globulin Ratio: 1.3 mmol/L (ref 1.00–2.00)
Albumin: 3.9 g/dL (ref 3.5–5.2)
Alk Phosphatase: 101 U/L (ref 35–117)
Anion Gap: 7 mmol/L (ref 2–17)
BUN: 10 mg/dL (ref 6–20)
CO2: 27 mmol/L (ref 22–29)
Calcium: 9.1 mg/dL (ref 8.6–10.0)
Chloride: 107 mmol/L (ref 98–107)
Creatinine: 0.6 mg/dL (ref 0.5–0.9)
GFR African American: 131 mL/min/{1.73_m2} (ref >=90)
GFR Non-African American: 113 mL/min/{1.73_m2} (ref >=90)
Globulin: 3 g/dL (ref 1.9–4.4)
Glucose: 99 mg/dL (ref 70–99)
Osmolaliy Calculated: 280 mosm/kg (ref 270–287)
Potassium: 4 mmol/L (ref 3.5–5.3)
Sodium: 141 mmol/L (ref 135–145)
Total Bilirubin: <0.15 mg/dL (ref 0.00–1.20)
Total Protein: 6.9 g/dL (ref 6.4–8.3)

## 2018-12-16 LAB — TROPONIN T: Troponin T: <0.01 ng/mL (ref 0.000–0.010)

## 2018-12-16 NOTE — ED Notes (Signed)
 ED Patient Education Note     Patient Education Materials Follows:  Cardiovascular     Nonspecific Chest Pain    Chest pain can be caused by many different conditions. There is always a chance that your pain could be related to something serious, such as a heart attack or a blood clot in your lungs. Chest pain can also be caused by conditions that are not life-threatening. If you have chest pain, it is very important to follow up with your health care provider.      CAUSES    Chest pain can be caused by:     Heartburn.     Pneumonia or bronchitis.     Anxiety or stress.     Inflammation around your heart (pericarditis) or lung (pleuritis or pleurisy).     A blood clot in your lung.     A collapsed lung (pneumothorax). It can develop suddenly on its own (spontaneous pneumothorax) or from trauma to the chest.     Shingles infection (varicella-zoster virus).     Heart attack.     Damage to the bones, muscles, and cartilage that make up your chest wall. This can include:    ? Bruised bones due to injury.    ? Strained muscles or cartilage due to frequent or repeated coughing or overwork.    ? Fracture to one or more ribs.    ? Sore cartilage due to inflammation (costochondritis).    RISK FACTORS    Risk factors for chest pain may include:     Activities that increase your risk for trauma or injury to your chest.     Respiratory infections or conditions that cause frequent coughing.     Medical conditions or overeating that can cause heartburn.     Heart disease or family history of heart disease.     Conditions or health behaviors that increase your risk of developing a blood clot.     Having had chicken pox (varicella zoster).    SIGNS AND SYMPTOMS    Chest pain can feel like:     Burning or tingling on the surface of your chest or deep in your chest.     Crushing, pressure, aching, or squeezing pain.     Dull or sharp pain that is worse when you move, cough, or take a deep breath.     Pain that is also felt in your  back, neck, shoulder, or arm, or pain that spreads to any of these areas.    Your chest pain may come and go, or it may stay constant.    DIAGNOSIS    Lab tests or other studies may be needed to find the cause of your pain. Your health care provider may have you take a test called an ambulatory ECG (electrocardiogram). An ECG records your heartbeat patterns at the time the test is performed. You may also have other tests, such as:     Transthoracic echocardiogram (TTE). During echocardiography, sound waves are used to create a picture of all of the heart structures and to look at how blood flows through your heart.     Transesophageal echocardiogram (TEE).?This is a more advanced imaging test that obtains images from inside your body. It allows your health care provider to see your heart in finer detail.     Cardiac monitoring. This allows your health care provider to monitor your heart rate and rhythm in real time.     Holter monitor. This is a  portable device that records your heartbeat and can help to diagnose abnormal heartbeats. It allows your health care provider to track your heart activity for several days, if needed.     Stress tests. These can be done through exercise or by taking medicine that makes your heart beat more quickly.     Blood tests.     Imaging tests.    TREATMENT    Your treatment depends on what is causing your chest pain. Treatment may include:     Medicines. These may include:    ? Acid blockers for heartburn.    ? Anti-inflammatory medicine.    ? Pain medicine for inflammatory conditions.    ? Antibiotic medicine, if an infection is present.    ? Medicines to dissolve blood clots.    ? Medicines to treat coronary artery disease.     Supportive care for conditions that do not require medicines. This may include:    ? Resting.    ? Applying heat or cold packs to injured areas.    ? Limiting activities until pain decreases.    HOME CARE INSTRUCTIONS     If you were prescribed an antibiotic  medicine, finish it all even if you start to feel better.     Avoid any activities that bring on chest pain.     Do not use any tobacco products, including cigarettes, chewing tobacco, or electronic cigarettes. If you need help quitting, ask your health care provider.     Do not drink alcohol.     Take medicines only as directed by your health care provider.     Keep all follow-up visits as directed by your health care provider. This is important. This includes any further testing if your chest pain does not go away.     If heartburn is the cause for your chest pain, you may be told to keep your head raised (elevated) while sleeping. This reduces the chance that acid will go  from your stomach into your esophagus.     Make lifestyle changes as directed by your health care provider. These may include:    ? Getting regular exercise. Ask your health care provider to suggest some activities that are safe for you.    ? Eating a heart-healthy diet. A registered dietitian can help you to learn healthy eating options.    ? Maintaining a healthy weight.    ? Managing diabetes, if necessary.    ? Reducing stress.    SEEK MEDICAL CARE IF:     Your chest pain does not go away after treatment.     You have a rash with blisters on your chest.     You have a fever.    SEEK IMMEDIATE MEDICAL CARE IF:     Your chest pain is worse.     You have an increasing cough, or you cough up blood.     You have severe abdominal pain.     You have severe weakness.     You faint.     You have chills.     You have sudden, unexplained chest discomfort.     You have sudden, unexplained discomfort in your arms, back, neck, or jaw.     You have shortness of breath at any time.     You suddenly start to sweat, or your skin gets clammy.     You feel nauseous or you vomit.     You suddenly feel light-headed or dizzy.  Your heart begins to beat quickly, or it feels like it is skipping beats.    These symptoms may represent a serious problem that is an  emergency. Do not wait to see if the symptoms will go away. Get medical help right away. Call your local emergency services (911 in the [place] Do not drive yourself to the hospital.    This information is not intended to replace advice given to you by your health care provider. Make sure you discuss any questions you have with your health care provider.    Document Released: 01/25/2005 Document Revised: 05/08/2014 Document Reviewed: 11/21/2013  Elsevier Interactive Patient Education ?2016 [employer]                 [employer]  Excuse from Work, [employer], or Physical Activity    ___________________________ needs to be excused from:  _____ Work  _____ [employer]  _____ Physical activity  beginning now and through the following date: ____________________.    _____ He or she may return to work or school but should still avoid the following physical activity or activities from now until ____________________.  Activity restrictions include:  _____ Lifting more than _______ lb  _____ Sitting longer than __________ minutes at a time  _____ Standing longer than ________ minutes at a time    _____ He or she may return to full physical activity as of ____________________.    Health Care Provider Name (printed): ____Justin A. Kay, MD______________    [facility] Provider (signature): ______________________________________  Date: ________________  This information is not intended to replace advice given to you by your health care provider. Make sure you discuss any questions you have with your health care provider.    Document Released: 10/11/2000 Document Revised: 05/08/2014 Document Reviewed: 11/17/2013  [facility] Patient Information 2016 [place], [place].

## 2018-12-16 NOTE — Discharge Summary (Signed)
ED Clinical Summary                     [facility]  [address]  [place], [place] [address]  [phone]          PERSON INFORMATION  Name: Molly Ward, Molly Ward Age:  43 Years DOB: [birth year]   Sex: Female Language: English PCP: Williams Che   Marital Status: Single Phone: [phone] Med Service: MED-Medicine   MRN: [id number] Acct# [id number] Arrival: 12/16/2018 13:21:00   Visit Reason: Chest pain; DIZZY WITH CHESTPRESSURE AND BACK PAIN Acuity: 3 LOS: 000 02:08   Address:    [address] [place] [address]   Diagnosis:    Dizziness; Nonspecific chest pain  Medications:          New Medications  Printed Prescriptions  hydrOXYzine (Vistaril 25 mg oral capsule) 1 Capsules Oral (given by mouth) 4 times a day as needed for anxiety. Refills: 1.  Last Dose:____________________  Medications that have not changed  Other Medications  lidocaine topical (lidocaine 5% topical film) 1 Patches Topical (on the skin) every day. Refills: 0.  Last Dose:____________________  Misc Medication (Magic Mouthwash) 1 part Viscous Lidocaine 2%; 1 part Maalox; 1 part diphenhydramine 12.5mg /34mL. Swish and spit.Marland Kitchen Refills: 0.  Last Dose:____________________  Misc Rx Supply (Blind Study medication) [facility].  Last Dose:____________________      Medications Administered During Visit:                Medication Dose Route   lorazepam 0.5 mg IV Push               Allergies      No Known Allergies      Major Tests and Procedures:  The following procedures and tests were performed during your ED visit.  COMMON PROCEDURES%>  COMMON PROCEDURES COMMENTS%>                PROVIDER INFORMATION               Provider Role Assigned Beatriz Chancellor, RN, Dawson Bills ED Nurse 12/16/2018 13:33:06    NORRIS-MD, Lorri Frederick ED Provider 12/16/2018 13:33:09        Attending Physician:  Londell Moh      Admit Doc  NORRIS-MD,  Lorri Frederick     Consulting Doc       VITALS INFORMATION  Vital Sign  Triage Latest   Temp Oral ORAL_1%> ORAL%>   Temp Temporal TEMPORAL_1%> TEMPORAL%>   Temp Intravascular INTRAVASCULAR_1%> INTRAVASCULAR%>   Temp Axillary AXILLARY_1%> AXILLARY%>   Temp Rectal RECTAL_1%> RECTAL%>   02 Sat 100 % 100 %   Respiratory Rate RATE_1%> RATE%>   Peripheral Pulse Rate PULSE RATE_1%> PULSE RATE%>   Apical Heart Rate HEART RATE_1%> HEART RATE%>   Blood Pressure BLOOD PRESSURE_1%>/ BLOOD PRESSURE_1%>86 mmHg BLOOD PRESSURE%> / BLOOD PRESSURE%>97 mmHg                 Immunizations      No Immunizations Documented This Visit          DISCHARGE INFORMATION   Discharge Disposition: H Outpt-Sent Home   Discharge Location:  Home   Discharge Date and Time:  12/16/2018 15:29:25   ED Checkout Date and Time:  12/16/2018 15:29:25     DEPART REASON INCOMPLETE INFORMATION               Depart Action Incomplete Reason  ED Clinical Summary                     [facility]  [address]  [place], [place] [address]  [phone]          PERSON INFORMATION  Name: Molly Ward, Molly Ward Age:  43 Years DOB: [birth year]   Sex: Female Language: English PCP: Williams Che   Marital Status: Single Phone: [phone] Med Service: MED-Medicine   MRN: [id number] Acct# [id number] Arrival: 12/16/2018 13:21:00   Visit Reason: Chest pain; DIZZY WITH CHESTPRESSURE AND BACK PAIN Acuity: 3 LOS: 000 02:08   Address:    [address] [place] [address]   Diagnosis:    Dizziness; Nonspecific chest pain  Medications:          New Medications  Printed Prescriptions  hydrOXYzine (Vistaril 25 mg oral capsule) 1 Capsules Oral (given by mouth) 4 times a day as needed for anxiety. Refills: 1.  Last Dose:____________________  Medications that have not changed  Other Medications  lidocaine topical (lidocaine 5% topical film) 1 Patches Topical (on the skin) every day. Refills: 0.  Last Dose:____________________  Misc Medication (Magic Mouthwash) 1 part Viscous Lidocaine 2%; 1 part Maalox; 1 part diphenhydramine 12.5mg /34mL. Swish and spit.Marland Kitchen Refills: 0.  Last Dose:____________________  Misc Rx Supply (Blind Study medication) [facility].  Last Dose:____________________      Medications Administered During Visit:                Medication Dose Route   lorazepam 0.5 mg IV Push               Allergies      No Known Allergies      Major Tests and Procedures:  The following procedures and tests were performed during your ED visit.  COMMON PROCEDURES%>  COMMON PROCEDURES COMMENTS%>                PROVIDER INFORMATION               Provider Role Assigned Beatriz Chancellor, RN, Dawson Bills ED Nurse 12/16/2018 13:33:06    NORRIS-MD, Lorri Frederick ED Provider 12/16/2018 13:33:09        Attending Physician:  Londell Moh      Admit Doc  NORRIS-MD,  Lorri Frederick     Consulting Doc       VITALS INFORMATION  Vital Sign  Triage Latest   Temp Oral ORAL_1%> ORAL%>   Temp Temporal TEMPORAL_1%> TEMPORAL%>   Temp Intravascular INTRAVASCULAR_1%> INTRAVASCULAR%>   Temp Axillary AXILLARY_1%> AXILLARY%>   Temp Rectal RECTAL_1%> RECTAL%>   02 Sat 100 % 100 %   Respiratory Rate RATE_1%> RATE%>   Peripheral Pulse Rate PULSE RATE_1%> PULSE RATE%>   Apical Heart Rate HEART RATE_1%> HEART RATE%>   Blood Pressure BLOOD PRESSURE_1%>/ BLOOD PRESSURE_1%>86 mmHg BLOOD PRESSURE%> / BLOOD PRESSURE%>97 mmHg                 Immunizations      No Immunizations Documented This Visit          DISCHARGE INFORMATION   Discharge Disposition: H Outpt-Sent Home   Discharge Location:  Home   Discharge Date and Time:  12/16/2018 15:29:25   ED Checkout Date and Time:  12/16/2018 15:29:25     DEPART REASON INCOMPLETE INFORMATION               Depart Action Incomplete Reason  ED Clinical Summary                     [facility]  [address]  [place], [place] [address]  [phone]          PERSON INFORMATION  Name: Molly Ward, Molly Ward Age:  43 Years DOB: [birth year]   Sex: Female Language: English PCP: Williams Che   Marital Status: Single Phone: [phone] Med Service: MED-Medicine   MRN: [id number] Acct# [id number] Arrival: 12/16/2018 13:21:00   Visit Reason: Chest pain; DIZZY WITH CHESTPRESSURE AND BACK PAIN Acuity: 3 LOS: 000 02:08   Address:    [address] [place] [address]   Diagnosis:    Dizziness; Nonspecific chest pain  Medications:          New Medications  Printed Prescriptions  hydrOXYzine (Vistaril 25 mg oral capsule) 1 Capsules Oral (given by mouth) 4 times a day as needed for anxiety. Refills: 1.  Last Dose:____________________  Medications that have not changed  Other Medications  lidocaine topical (lidocaine 5% topical film) 1 Patches Topical (on the skin) every day. Refills: 0.  Last Dose:____________________  Misc Medication (Magic Mouthwash) 1 part Viscous Lidocaine 2%; 1 part Maalox; 1 part diphenhydramine 12.5mg /34mL. Swish and spit.Marland Kitchen Refills: 0.  Last Dose:____________________  Misc Rx Supply (Blind Study medication) [facility].  Last Dose:____________________      Medications Administered During Visit:                Medication Dose Route   lorazepam 0.5 mg IV Push               Allergies      No Known Allergies      Major Tests and Procedures:  The following procedures and tests were performed during your ED visit.  COMMON PROCEDURES%>  COMMON PROCEDURES COMMENTS%>                PROVIDER INFORMATION               Provider Role Assigned Beatriz Chancellor, RN, Dawson Bills ED Nurse 12/16/2018 13:33:06    NORRIS-MD, Lorri Frederick ED Provider 12/16/2018 13:33:09        Attending Physician:  Londell Moh      Admit Doc  NORRIS-MD,  Lorri Frederick     Consulting Doc       VITALS INFORMATION  Vital Sign  Triage Latest   Temp Oral ORAL_1%> ORAL%>   Temp Temporal TEMPORAL_1%> TEMPORAL%>   Temp Intravascular INTRAVASCULAR_1%> INTRAVASCULAR%>   Temp Axillary AXILLARY_1%> AXILLARY%>   Temp Rectal RECTAL_1%> RECTAL%>   02 Sat 100 % 100 %   Respiratory Rate RATE_1%> RATE%>   Peripheral Pulse Rate PULSE RATE_1%> PULSE RATE%>   Apical Heart Rate HEART RATE_1%> HEART RATE%>   Blood Pressure BLOOD PRESSURE_1%>/ BLOOD PRESSURE_1%>86 mmHg BLOOD PRESSURE%> / BLOOD PRESSURE%>97 mmHg                 Immunizations      No Immunizations Documented This Visit          DISCHARGE INFORMATION   Discharge Disposition: H Outpt-Sent Home   Discharge Location:  Home   Discharge Date and Time:  12/16/2018 15:29:25   ED Checkout Date and Time:  12/16/2018 15:29:25     DEPART REASON INCOMPLETE INFORMATION               Depart Action Incomplete Reason  ED Clinical Summary                     [facility]  [address]  [place], [place] [address]  [phone]          PERSON INFORMATION  Name: Molly Ward, Molly Ward Age:  43 Years DOB: [birth year]   Sex: Female Language: English PCP: Williams Che   Marital Status: Single Phone: [phone] Med Service: MED-Medicine   MRN: [id number] Acct# [id number] Arrival: 12/16/2018 13:21:00   Visit Reason: Chest pain; DIZZY WITH CHESTPRESSURE AND BACK PAIN Acuity: 3 LOS: 000 02:08   Address:    [address] [place] [address]   Diagnosis:    Dizziness; Nonspecific chest pain  Medications:          New Medications  Printed Prescriptions  hydrOXYzine (Vistaril 25 mg oral capsule) 1 Capsules Oral (given by mouth) 4 times a day as needed for anxiety. Refills: 1.  Last Dose:____________________  Medications that have not changed  Other Medications  lidocaine topical (lidocaine 5% topical film) 1 Patches Topical (on the skin) every day. Refills: 0.  Last Dose:____________________  Misc Medication (Magic Mouthwash) 1 part Viscous Lidocaine 2%; 1 part Maalox; 1 part diphenhydramine 12.5mg /34mL. Swish and spit.Marland Kitchen Refills: 0.  Last Dose:____________________  Misc Rx Supply (Blind Study medication) [facility].  Last Dose:____________________      Medications Administered During Visit:                Medication Dose Route   lorazepam 0.5 mg IV Push               Allergies      No Known Allergies      Major Tests and Procedures:  The following procedures and tests were performed during your ED visit.  COMMON PROCEDURES%>  COMMON PROCEDURES COMMENTS%>                PROVIDER INFORMATION               Provider Role Assigned Beatriz Chancellor, RN, Dawson Bills ED Nurse 12/16/2018 13:33:06    NORRIS-MD, Lorri Frederick ED Provider 12/16/2018 13:33:09        Attending Physician:  Londell Moh      Admit Doc  NORRIS-MD,  Lorri Frederick     Consulting Doc       VITALS INFORMATION  Vital Sign  Triage Latest   Temp Oral ORAL_1%> ORAL%>   Temp Temporal TEMPORAL_1%> TEMPORAL%>   Temp Intravascular INTRAVASCULAR_1%> INTRAVASCULAR%>   Temp Axillary AXILLARY_1%> AXILLARY%>   Temp Rectal RECTAL_1%> RECTAL%>   02 Sat 100 % 100 %   Respiratory Rate RATE_1%> RATE%>   Peripheral Pulse Rate PULSE RATE_1%> PULSE RATE%>   Apical Heart Rate HEART RATE_1%> HEART RATE%>   Blood Pressure BLOOD PRESSURE_1%>/ BLOOD PRESSURE_1%>86 mmHg BLOOD PRESSURE%> / BLOOD PRESSURE%>97 mmHg                 Immunizations      No Immunizations Documented This Visit          DISCHARGE INFORMATION   Discharge Disposition: H Outpt-Sent Home   Discharge Location:  Home   Discharge Date and Time:  12/16/2018 15:29:25   ED Checkout Date and Time:  12/16/2018 15:29:25     DEPART REASON INCOMPLETE INFORMATION               Depart Action Incomplete Reason  ED Clinical Summary                     [facility]  [address]  [place], [place] [address]  [phone]          PERSON INFORMATION  Name: Molly Ward, Molly Ward Age:  43 Years DOB: [birth year]   Sex: Female Language: English PCP: Williams Che   Marital Status: Single Phone: [phone] Med Service: MED-Medicine   MRN: [id number] Acct# [id number] Arrival: 12/16/2018 13:21:00   Visit Reason: Chest pain; DIZZY WITH CHESTPRESSURE AND BACK PAIN Acuity: 3 LOS: 000 02:08   Address:    [address] [place] [address]   Diagnosis:    Dizziness; Nonspecific chest pain  Medications:          New Medications  Printed Prescriptions  hydrOXYzine (Vistaril 25 mg oral capsule) 1 Capsules Oral (given by mouth) 4 times a day as needed for anxiety. Refills: 1.  Last Dose:____________________  Medications that have not changed  Other Medications  lidocaine topical (lidocaine 5% topical film) 1 Patches Topical (on the skin) every day. Refills: 0.  Last Dose:____________________  Misc Medication (Magic Mouthwash) 1 part Viscous Lidocaine 2%; 1 part Maalox; 1 part diphenhydramine 12.5mg /34mL. Swish and spit.Marland Kitchen Refills: 0.  Last Dose:____________________  Misc Rx Supply (Blind Study medication) [facility].  Last Dose:____________________      Medications Administered During Visit:                Medication Dose Route   lorazepam 0.5 mg IV Push               Allergies      No Known Allergies      Major Tests and Procedures:  The following procedures and tests were performed during your ED visit.  COMMON PROCEDURES%>  COMMON PROCEDURES COMMENTS%>                PROVIDER INFORMATION               Provider Role Assigned Beatriz Chancellor, RN, Dawson Bills ED Nurse 12/16/2018 13:33:06    NORRIS-MD, Lorri Frederick ED Provider 12/16/2018 13:33:09        Attending Physician:  Londell Moh      Admit Doc  NORRIS-MD,  Lorri Frederick     Consulting Doc       VITALS INFORMATION  Vital Sign  Triage Latest   Temp Oral ORAL_1%> ORAL%>   Temp Temporal TEMPORAL_1%> TEMPORAL%>   Temp Intravascular INTRAVASCULAR_1%> INTRAVASCULAR%>   Temp Axillary AXILLARY_1%> AXILLARY%>   Temp Rectal RECTAL_1%> RECTAL%>   02 Sat 100 % 100 %   Respiratory Rate RATE_1%> RATE%>   Peripheral Pulse Rate PULSE RATE_1%> PULSE RATE%>   Apical Heart Rate HEART RATE_1%> HEART RATE%>   Blood Pressure BLOOD PRESSURE_1%>/ BLOOD PRESSURE_1%>86 mmHg BLOOD PRESSURE%> / BLOOD PRESSURE%>97 mmHg                 Immunizations      No Immunizations Documented This Visit          DISCHARGE INFORMATION   Discharge Disposition: H Outpt-Sent Home   Discharge Location:  Home   Discharge Date and Time:  12/16/2018 15:29:25   ED Checkout Date and Time:  12/16/2018 15:29:25     DEPART REASON INCOMPLETE INFORMATION               Depart Action Incomplete Reason  Interactive View/I&O Recently assessed               Problems      Active           Fibroids              Smoking Status      Current some day smoker         PATIENT EDUCATION INFORMATION  Instructions:     Form Ranell Patrick) - Excuse from Work, [employer], or Physical Activity (Custom) [phone]); Nonspecific Chest Pain     Follow up:                   With: Address: When:   Romana Juniper, Physician - Cardiologist **[address], [address] [place], [place] [address]  [phone] Business (1) Within 1 week   Comments:   Take all medications as prescribed. Return to the ED in the next 12 to 24 hours if symptoms have not improved or if worse in any way for further evaluation. Arrange follow-up with cardiologist for continuing management of today's complaints. You may need a stress test and cardiac ultrasound for further evaluation. Failure to do so could result in worsening of condition or failure to diagnose underlying cardiac condition.     Please read all instructions as there may be items that were not fully discussed during your ED evaluation including all diagnosis and prescription  medication instructions.              ED PROVIDER DOCUMENTATION     Patient:   TAJ, DILS             MRN: [id number]            FIN: [id number]               Age:   41 years     Sex:  Female     DOB:  07-02-1975   Associated Diagnoses:   Nonspecific chest pain; Dizziness   Author:   Londell Moh      Basic Information   Time seen: Provider Seen (ST)   ED Provider/Time:    Londell Moh / 12/16/2018 13:33  .   History source: Patient.   Arrival mode: Private vehicle.   History limitation: None.   Additional information: Chief Complaint from Nursing Triage Note   Chief Complaint  Chief Complaint: Pt reports being a work and getting dizzy and then stated having c/p. Pt reports a hx a anxiety. (12/16/18 13:27:00).      History of Present Illness   The patient presents with chest pain.  The onset was today.  The course/duration of symptoms is constant.  Location: chest. Radiating pain: none. The character of symptoms is heaviness and pressure.  The degree at onset was moderate.  The degree at maximum was moderate.  The degree at present is moderate.  The exacerbating factor is none.  The relieving factor is none.  Risk factors consist of smoking.  Prior episodes: none.  Therapy today None.  Associated symptoms: shortness of breath and Dizziness.  Patient presents to the ED with complaints of chest pain that started while at work that was also associated with dizziness.  Patient reports no cardiac issues that she is aware of.  She reports she has had have inhalers from time to time due to allergies and postnasal drip.  She reports no recent sick contacts, cough, fever, nausea, vomiting, headache, neurological changes, or other

## 2018-12-16 NOTE — ED Provider Notes (Signed)
 Chest Pain *ED        Patient:   Molly Ward, Molly Ward             MRN: [id number]            FIN: [id number]               Age:   43 years     Sex:  Female     DOB:  Nov 16, 1975   Associated Diagnoses:   Nonspecific chest pain; Dizziness   Author:   NORRIS-MD,  JUSTIN AARON      Basic Information   Time seen: Provider Seen (ST)   ED Provider/Time:    NORRIS-MD,  JUSTIN AARON / 12/16/2018 13:33  .   History source: Patient.   Arrival mode: Private vehicle.   History limitation: None.   Additional information: Chief Complaint from Nursing Triage Note   Chief Complaint  Chief Complaint: Pt reports being a work and getting dizzy and then stated having c/p. Pt reports a hx a anxiety. (12/16/18 13:27:00).      History of Present Illness   The patient presents with chest pain.  The onset was today.  The course/duration of symptoms is constant.  Location: chest. Radiating pain: none. The character of symptoms is heaviness and pressure.  The degree at onset was moderate.  The degree at maximum was moderate.  The degree at present is moderate.  The exacerbating factor is none.  The relieving factor is none.  Risk factors consist of smoking.  Prior episodes: none.  Therapy today None.  Associated symptoms: shortness of breath and Dizziness.  Patient presents to the ED with complaints of chest pain that started while at work that was also associated with dizziness.  Patient reports no cardiac issues that she is aware of.  She reports she has had have inhalers from time to time due to allergies and postnasal drip.  She reports no recent sick contacts, cough, fever, nausea, vomiting, headache, neurological changes, or other complaints at this time.        Review of Systems   Constitutional symptoms:  No fever, no chills, no fatigue.    Skin symptoms:  No rash, no dryness.    Eye symptoms:  Vision unchanged.   Respiratory symptoms:  No cough, no stridor, no wheezing.    Cardiovascular symptoms:  No tachycardia, no syncope, no diaphoresis.     Gastrointestinal symptoms:  No abdominal pain, no nausea, no vomiting, no diarrhea, no constipation.    Genitourinary symptoms:  No dysuria, no hematuria.    Musculoskeletal symptoms:  No back pain, no Joint pain.    Neurologic symptoms:  No headache, no altered level of consciousness, no focal weakness.    Psychiatric symptoms:  No anxiety, no depression.    Hematologic/Lymphatic symptoms:  Bleeding tendency negative, no petechiae.              Additional review of systems information: All other systems reviewed and otherwise negative.      Health Status   Allergies:    Allergic Reactions (Selected)  No Known Allergies.   Medications:  (Selected)   Prescriptions  Prescribed  Magic Mouthwash: See Instructions, 1 part Viscous Lidocaine 2%; 1 part Maalox; 1 part diphenhydramine 12.5mg /73mL. Swish and spit., 200 mL, 0 Refill(s)  lidocaine 5% topical film: 1 patches, Topical, Daily, 30 patches, 0 Refill(s)  Documented Medications  Documented  Blind Study medication: See Instructions, [place], 0 Refill(s).   Immunizations: Up to date.  Past Medical/ Family/ Social History   Medical history:    No active or resolved past medical history items have been selected or recorded., Reviewed as documented in chart.   Surgical history:    No active procedure history items have been selected or recorded., Reviewed as documented in chart.   Family history:    No family history items have been selected or recorded., Reviewed as documented in chart.   Social history:    Social & Psychosocial Habits    Alcohol  05/15/2017  Use: Current    Tobacco  06/21/2016  Use: Current some day smoker  , Reviewed as documented in chart.   Problem list:    Active Problems (1)  Fibroids   , per nurse's notes.      Physical Examination               Vital Signs   Vital Signs   12/16/2018 13:27 EDT Systolic Blood Pressure 115 mmHg    Diastolic Blood Pressure 86 mmHg    Temperature Oral 37 degC    Heart Rate Monitored 55 bpm  LOW     Respiratory Rate 18 br/min    SpO2 100 %   .   Measurements   12/16/2018 13:31 EDT Body Mass Index est meas 26.17 kg/m2    Body Mass Index Measured 26.17 kg/m2   12/16/2018 13:27 EDT Height/Length Measured 160 cm    Weight Dosing 67 kg   .   Basic Oxygen Information   12/16/2018 13:27 EDT      SpO2                      100 %  .   General:  Alert, no acute distress.    Skin:  Warm, dry, intact.    Head:  Normocephalic, atraumatic.    Neck:  Supple, trachea midline, no tenderness, no JVD.    Eye:  Pupils are equal, round and reactive to light, extraocular movements are intact.    Ears, nose, mouth and throat:  Oral mucosa moist.   Cardiovascular:  Regular rate and rhythm, No murmur, Normal peripheral perfusion, No edema.    Respiratory:  Lungs are clear to auscultation, respirations are non-labored, breath sounds are equal, Symmetrical chest wall expansion.    Chest wall:  No tenderness, No deformity.    Back:  Nontender, Normal range of motion.    Musculoskeletal:  Normal ROM, normal strength, no tenderness, no swelling, no deformity.    Gastrointestinal:  Soft, Nontender, Non distended, Normal bowel sounds, No organomegaly.    Genitourinary   Neurological:  Alert and oriented to person, place, time, and situation, No focal neurological deficit observed, CN II-XII intact.    Lymphatics:  No lymphadenopathy.   Psychiatric:  Cooperative, appropriate mood & affect.       Medical Decision Making   Differential Diagnosis:  Unstable angina, angina, anxiety, atypical chest pain.    Rationale:  12/16/2018 13:39:28: Based on the patient's presentation and history of present illness, patient will have laboratory analysis and imaging studies for further management of today's complaints.  Patient will be monitored for any signs of deterioration and we will adjust treatments accordingly.   Documents reviewed:  Emergency department nurses' notes, emergency department records, prior records.    Orders  Launch Order Profile (Selected)    Inpatient Orders  InProcess (Procedure Completed)  EKG:   Ordered  NIBP/Pulse Ox Monitoring:   Saline Lock Insert:   Ordered (  Dispatched)  CBC Automated, With Diff:   CMP:   Troponin T:   Ordered (Exam Ordered)  PCXR: .   Electrocardiogram:  Emergency Provider interpretation performed by me, EKG was personally interpreted by me. Please see saved EKG report.    Results review:  Lab results : Lab View   12/16/2018 15:02 EDT Estimated Creatinine Clearance 113.43 mL/min   12/16/2018 14:20 EDT WBC 9.3 x10e3/mcL    RBC 5.13 x10e6/mcL    Hgb 10.5 g/dL  LOW    HCT 64.7 %    MCV 68.6 fL  LOW    MCH 20.5 pg  LOW    MCHC 29.8 g/dL  LOW    RDW 83.6 %  HI    Platelet 235 x10e3/mcL    MPV 11.4 fL    Neutro Auto 61.9 %    Neutro Absolute 5.7 x10e3/mcL    Immature Grans Percent 0.3 %    Immature Grans Absolute 0.03 x10e3/mcL    Lymph Auto 29.4 %    Lymph Absolute 2.7 x10e3/mcL    Mono Auto 4.8 %    Mono Absolute 0.5 x10e3/mcL    Eosinophil Percent 2.6 %    Eos Absolute 0.2 x10e3/mcL    Basophil Auto 1.0 %    Baso Absolute 0.1 x10e3/mcL    NRBC Absolute Auto 0.000 x10e3/mcL    NRBC Percent Auto 0.0 %    Sodium Lvl 141 mmol/L    Potassium Lvl 4.0 mmol/L    Chloride 107 mmol/L    CO2 27 mmol/L    Glucose Random 99 mg/dL    BUN 10 mg/dL    Creatinine Lvl 0.6 mg/dL    AGAP 7 mmol/L    Osmolality Calc 280 mOsm/kg    Calcium Lvl 9.1 mg/dL    Protein Total 6.9 g/dL    Albumin Lvl 3.9 g/dL    Globulin Calc 3.0 g/dL    AG Ratio Calc 8.69 mmol/L    Alk Phos 101 unit/L    AST 15 unit/L    ALT 11 unit/L    eGFR AA 131 mL/min/1.36m    eGFR Non-AA 113 mL/min/1.71m    Bili Total <0.15 mg/dL    Trop T Quant <9.989 ng/mL   12/16/2018 13:31 EDT Estimated Creatinine Clearance 97.22 mL/min   .   Chest X-Ray:  Chest AP: 12/16/18    INDICATION: Chest pain.    COMPARISON: None.    FINDINGS: Cardiac mediastinal silhouette is within normal limits. No focal  consolidation. No pleural effusion. No pneumothorax. No acute  osseous  abnormality.    IMPRESSION:  No focal consolidation. No pleural effusion. No pneumothorax.    Signature Line  ***** Final *****      Releasing Radiologist:   MARYRUTH REDELL BROCKS  Released Date and Time:  12/16/18 14:15.      Reexamination/ Reevaluation   Time: 12/16/2018 15:14:00 .   Vital signs   Basic Oxygen Information   12/16/2018 13:27 EDT      SpO2                      100 %     Course: improving.   Assessment: reexam performed, exam improved.   Notes: Discussed all findings with patient/family and voiced understanding. No acute issues since ED evaluation began. No acute findings to warrant further ED evaluation or admission at this time. Patient/family was encouraged to follow-up with his/her primary care physician and/or specialist for continuing management of today's complaints.  Impression and Plan   Diagnosis   Nonspecific chest pain (ICD10-CM R07.9, Discharge, Medical)   Dizziness (ICD10-CM R42, Discharge, Medical)   Plan   Condition: Stable.    Disposition: Medically cleared, Discharged: Time  12/16/2018 15:16:00, to home.    Prescriptions: Launch prescriptions   Pharmacy:  Vistaril 25 mg oral capsule (Prescribe): 25 mg, 1 caps, Oral, QID, PRN: for anxiety, 40 caps, 1 Refill(s).    Patient was given the following educational materials: Nonspecific Chest Pain, Form Adalberto) - Excuse from Work, [employer], or Physical Activity (Custom) [phone]).    Follow up with: VIVIEN COPPING, Physician - Cardiologist Within 1 week Take all medications as prescribed.  Return to the ED in the next 12 to 24 hours if symptoms have not improved or if worse in any way for further evaluation.  Arrange follow-up with cardiologist for continuing management of today's complaints. You may need a stress test and cardiac ultrasound for further evaluation. Failure to do so could result in worsening of condition or failure to diagnose underlying cardiac condition.    Please read all instructions as there may be items  that were not fully discussed during your ED evaluation including all diagnosis and prescription medication instructions..    Counseled: Patient, Regarding diagnosis, Regarding diagnostic results, Regarding treatment plan, Regarding prescription, Patient indicated understanding of instructions.    Notes: The patient is resting comfortably and feels better, is alert and in no distress. The repeat examination is unremarkable and benign. The electrocardiogram shows no signs of acute ischemia and the history, exam, diagnostic testing and current condition do not suggest that this patient is having an acute myocardial infarction, significant arrhythmia, unstable angina, esophageal perforation, pulmonary embolism, aortic dissection, pneumothorax, severe pneumonia, sepsis or other significant pathology that would warrant further testing, continued ED treatment, admission, or cardiology or other specialist consultation at this point. The vital signs have been stable. The patient's condition is stable and appropriate for discharge. The patient will pursue further outpatient evaluation with the primary care physician, other designated physician or cardiologist. The patient and/or caregivers have expressed a clear and thorough understanding and agree to follow up as instructed. I discussed with the patient and/or caregiver the rapid rule-out protocol for acute coronary syndrome and myocardial infarction and that, given the findings during this ED evaluation, there is no clinical, ECG or laboratory evidence of injury to the heart. I further explained that there is no currently validated protocol that can reliably risk stratify a patient into the very low risk category for significant cardiac disease. Therefore it remains possible that there is underlying pathology that may develop into an acute coronary syndrome at some time in the future. I discussed this with the patient and/or caregivers at length, as well as the necessary  steps that may include follow-up, stress testing, cardiac imaging and further lab evaluation for further assessment. The patient and/or caregivers have expressed a clear and thorough understanding and agree to follow up as instructed.    Signature Line     Electronically Signed on 12/16/2018 03:17 PM EDT   ________________________________________________   NORRIS-MD,  JUSTIN AARON               Modified by: NORRIS-MD,  JUSTIN AARON on 12/16/2018 02:27 PM EDT      Modified by: NORRIS-MD,  JUSTIN AARON on 12/16/2018 03:17 PM EDT

## 2018-12-16 NOTE — ED Notes (Signed)
ED Triage Note       ED Triage Adult Entered On:  12/16/2018 13:28 EDT    Performed On:  12/16/2018 13:27 EDT by Claiborne Billings, RN, Guy Sandifer               Triage   Chief Complaint :   Pt reports being a work and getting dizzy and then stated having c/p. Pt reports a hx a anxiety.   Numeric Rating Pain Scale :   10 = Worst possible pain   Infectious Disease Documentation :   Document assessment   Heart Rate Monitored :   55 bpm (LOW)    Respiratory Rate :   18 br/min   SpO2 :   100 %   Weight Dosing :   67 kg(Converted to: 147 lb 11 oz)    Height :   160 cm(Converted to: 5 ft 3 in)    Body Mass Index Dosing :   26 kg/m2   ED General Section :   Document assessment   ED Allergies Section :   Document assessment   Leonia Corona - 12/16/2018 13:29 EDT   Duane Lope Mode of Arrival :   Private vehicle   Patient presentation :   None of the above   Chief Complaint or Presentation suggest infection :   No   Le[place] Corona - 12/16/2018 13:27 EDT   Claiborne Billings RN, Guy Sandifer - 12/16/2018 13:27 EDT   DCP GENERIC CODE   Tracking Acuity :   3   Le[place] Corona - 12/16/2018 13:29 EDT   Tracking Group :   ED [facility] Tracking [facility]   Claiborne Billings, RN, Guy Sandifer - 12/16/2018 13:27 EDT   Pregnancy Status :   Patient denies   ED Reason for Visit Section :   Document assessment   ED Quick Assessment :   Patient appears awake, alert, oriented to baseline. Skin warm and dry. Moves all extremities. Respiration even and unlabored. Appears in no apparent distress.   Claiborne Billings, RN, Guy Sandifer - 12/16/2018 13:27 EDT   ID Risk Screen Symptoms   Recent Travel History :   No recent travel   Close Contact with COVID-19 ID :   No   Last 14 days COVID-19 ID :   No   TB Symptom Screen :   No symptoms   C. diff Symptom/History ID :   Neither of the above   Le[place] Corona - 12/16/2018 13:29 EDT   Allergies   (As Of: 12/16/2018 13:31:12 EDT)   Allergies (Active)   No Known Allergies  Estimated Onset Date:   Unspecified ; Created ByErroll Luna, RN, KRISTY M; Reaction Status:    Active ; Category:   Drug ; Substance:   No Known Allergies ; Type:   Allergy ; Updated By:   Maylene Roes; Reviewed Date:   12/16/2018 13:29 EDT        Psycho-Social   Last 3 mo, thoughts killing self/others :   Patient denies   Le[place] Corona - 12/16/2018 13:29 EDT   ED Reason for Visit   (As Of: 12/16/2018 13:28:08 EDT)   Problems(Active)    Fibroids (IMO  :[id number] )  Name of Problem:   Fibroids ; Recorder:   Carollee Sires; Confirmation:   Confirmed ; Classification:   Medical ; Code:   119147 ; Contributor System:   Conservation officer, nature ; Last Updated:   06/21/2016 19:37 EST ;  Life Cycle Date:   06/21/2016 ; Life Cycle Status:   Active ; Vocabulary:   IMO          Diagnoses(Active)    Chest pain  Date:   12/16/2018 ; Diagnosis Type:   Reason For Visit ; Confirmation:   Complaint of ; Clinical Dx:   Chest pain ; Classification:   Medical ; Clinical Service:   Non-Specified ; Code:   PNED ; Probability:   0 ; Diagnosis Code:   [id number]

## 2018-12-16 NOTE — ED Notes (Signed)
 ED Patient Summary       ;       [facility] Emergency Department  [address], [place] [address]  [phone]  Discharge Instructions (Patient)  _______________________________________     Name: Molly Ward, Molly Ward  DOB: 1975-05-12                   MRN: [id number]                   FIN: [phone]  Reason For Visit: Chest pain; DIZZY WITH CHESTPRESSURE AND BACK PAIN  Final Diagnosis: Dizziness; Nonspecific chest pain     Visit Date: 12/16/2018 13:21:00  Address: [address] [place] [address]  Phone: [phone]     Primary Care Provider:      Name: HORACIO LAURAINE BRAVO      Phone: [phone]        Emergency Department Providers:         Primary Physician:   NORRIS-MD, JUSTIN AARON         [facility] would like to thank you for allowing us  to assist you with your healthcare needs. The following includes patient education materials and information regarding your injury/illness.     Follow-up Instructions:  You were treated today on an emergency basis. If instructed, please contact your primary care provider to arrange for follow-up and for any further concerns. Whether you have been referred to your primary care doctor or a specialist, please follow-up as instructed.      If you do not have a doctor, you may call [phone] for assistance with finding a [facility] care physician or specialist. Staff is available to help schedule you an appointment.      Not sure where to go with questions about your health? We're here for you. The [facility] "Ask a Nurse" Line in staffed by experienced nurses and is a free service to the community, available Monday - Friday from 8AM to 5PM. Call [phone].      If your condition worsens before your follow-up with an outpatient physician, please return to the Emergency Department.              With: Address: When:   VIVIEN COPPING, Physician - Cardiologist **[address], [address] [place], [place] [address]  [phone] Business (1) Within 1 week   Comments:   Take all medications as prescribed. Return to the ED in the next 12 to 24 hours if symptoms have not improved or if worse in any way for further evaluation. Arrange follow-up with cardiologist for continuing management of today's complaints. You may need a stress test and cardiac ultrasound for further evaluation. Failure to do so could result in worsening of condition or failure to diagnose underlying cardiac condition.     Please read all instructions as there may be items that were not fully discussed during your ED evaluation including all diagnosis and prescription medication instructions.              Printed Prescriptions:    Patient Education Materials:  Discharge Orders          Discharge Patient 12/16/18 15:17:00 EDT         Comment:      Form Adalberto) - Excuse from Work, [employer], or Physical Activity (Custom) [phone]); Nonspecific Chest Pain           [facility]  Excuse from Work, [employer], or Physical Activity    ___________________________ needs to be excused from:  _____ Work  _____ [employer]  _____ Physical activity  beginning now and through the following date: ____________________.    _____ He or she may return to work or school but should still avoid the following physical activity or activities from now until ____________________.  Activity restrictions include:  _____ Lifting more than _______ lb  _____ Sitting longer than __________ minutes at a time  _____ Standing longer than ________ minutes at a time    _____ He or she may return to full physical activity as of ____________________.    Health Care Provider Name (printed): ____Justin A. Kay, MD______________    [facility] Provider (signature): ______________________________________  Date: ________________  This information is not intended to replace advice given to you by your health care provider. Make sure you discuss any questions you have  with your health care provider.    Document Released: 10/11/2000 Document Revised: 05/08/2014 Document Reviewed: 11/17/2013  [facility] Patient Information 2016 [place], [place].       Nonspecific Chest Pain    Chest pain can be caused by many different conditions. There is always a chance that your pain could be related to something serious, such as a heart attack or a blood clot in your lungs. Chest pain can also be caused by conditions that are not life-threatening. If you have chest pain, it is very important to follow up with your health care provider.      CAUSES    Chest pain can be caused by:     Heartburn.     Pneumonia or bronchitis.     Anxiety or stress.     Inflammation around your heart (pericarditis) or lung (pleuritis or pleurisy).     A blood clot in your lung.     A collapsed lung (pneumothorax). It can develop suddenly on its own (spontaneous pneumothorax) or from trauma to the chest.     Shingles infection (varicella-zoster virus).     Heart attack.     Damage to the bones, muscles, and cartilage that make up your chest wall. This can include:    ? Bruised bones due to injury.    ? Strained muscles or cartilage due to frequent or repeated coughing or overwork.    ? Fracture to one or more ribs.    ? Sore cartilage due to inflammation (costochondritis).    RISK FACTORS    Risk factors for chest pain may include:     Activities that increase your risk for trauma or injury to your chest.     Respiratory infections or conditions that cause frequent coughing.     Medical conditions or overeating that can cause heartburn.     Heart disease or family history of heart disease.     Conditions or health behaviors that increase your risk of developing a blood clot.     Having had chicken pox (varicella zoster).    SIGNS AND SYMPTOMS    Chest pain can feel like:     Burning or tingling on the surface of your chest or deep in your chest.     Crushing, pressure, aching, or squeezing pain.     Dull or sharp pain  that is worse when you move, cough, or take a deep breath.     Pain that is also felt in your back, neck, shoulder, or arm, or pain that spreads to any of these areas.  Your chest pain may come and go, or it may stay constant.    DIAGNOSIS    Lab tests or other studies may be needed to find the cause of your pain. Your health care provider may have you take a test called an ambulatory ECG (electrocardiogram). An ECG records your heartbeat patterns at the time the test is performed. You may also have other tests, such as:     Transthoracic echocardiogram (TTE). During echocardiography, sound waves are used to create a picture of all of the heart structures and to look at how blood flows through your heart.     Transesophageal echocardiogram (TEE).?This is a more advanced imaging test that obtains images from inside your body. It allows your health care provider to see your heart in finer detail.     Cardiac monitoring. This allows your health care provider to monitor your heart rate and rhythm in real time.     Holter monitor. This is a portable device that records your heartbeat and can help to diagnose abnormal heartbeats. It allows your health care provider to track your heart activity for several days, if needed.     Stress tests. These can be done through exercise or by taking medicine that makes your heart beat more quickly.     Blood tests.     Imaging tests.    TREATMENT    Your treatment depends on what is causing your chest pain. Treatment may include:     Medicines. These may include:    ? Acid blockers for heartburn.    ? Anti-inflammatory medicine.    ? Pain medicine for inflammatory conditions.    ? Antibiotic medicine, if an infection is present.    ? Medicines to dissolve blood clots.    ? Medicines to treat coronary artery disease.     Supportive care for conditions that do not require medicines. This may include:    ? Resting.    ? Applying heat or cold packs to injured areas.    ? Limiting  activities until pain decreases.    HOME CARE INSTRUCTIONS     If you were prescribed an antibiotic medicine, finish it all even if you start to feel better.     Avoid any activities that bring on chest pain.     Do not use any tobacco products, including cigarettes, chewing tobacco, or electronic cigarettes. If you need help quitting, ask your health care provider.     Do not drink alcohol.     Take medicines only as directed by your health care provider.     Keep all follow-up visits as directed by your health care provider. This is important. This includes any further testing if your chest pain does not go away.     If heartburn is the cause for your chest pain, you may be told to keep your head raised (elevated) while sleeping. This reduces the chance that acid will go  from your stomach into your esophagus.     Make lifestyle changes as directed by your health care provider. These may include:    ? Getting regular exercise. Ask your health care provider to suggest some activities that are safe for you.    ? Eating a heart-healthy diet. A registered dietitian can help you to learn healthy eating options.    ? Maintaining a healthy weight.    ? Managing diabetes, if necessary.    ? Reducing stress.    SEEK MEDICAL CARE IF:  Your chest pain does not go away after treatment.     You have a rash with blisters on your chest.     You have a fever.    SEEK IMMEDIATE MEDICAL CARE IF:     Your chest pain is worse.     You have an increasing cough, or you cough up blood.     You have severe abdominal pain.     You have severe weakness.     You faint.     You have chills.     You have sudden, unexplained chest discomfort.     You have sudden, unexplained discomfort in your arms, back, neck, or jaw.     You have shortness of breath at any time.     You suddenly start to sweat, or your skin gets clammy.     You feel nauseous or you vomit.     You suddenly feel light-headed or dizzy.     Your heart begins to beat quickly,  or it feels like it is skipping beats.    These symptoms may represent a serious problem that is an emergency. Do not wait to see if the symptoms will go away. Get medical help right away. Call your local emergency services (911 in the [place] Do not drive yourself to the hospital.    This information is not intended to replace advice given to you by your health care provider. Make sure you discuss any questions you have with your health care provider.    Document Released: 01/25/2005 Document Revised: 05/08/2014 Document Reviewed: 11/21/2013  Elsevier Interactive Patient Education ?2016 [employer]         Allergy Info: No Known Allergies     Medication Information:  [facility] with [facility] complete list of medications post discharge, if you have been instructed to stop taking a medication please ensure you also follow up with this information to your Primary Care Physician.  Unless otherwise noted, patient will continue to take medications as prescribed prior to the Emergency Room visit.  Any specific questions regarding your chronic medications and dosages should be discussed with your physician(s) and pharmacist.          hydrOXYzine (Vistaril 25 mg oral capsule) 1 Capsules Oral (given by mouth) 4 times a day as needed for anxiety. Refills: 1.  lidocaine topical (lidocaine 5% topical film) 1 Patches Topical (on the skin) every day. Refills: 0.  Misc Medication (Magic Mouthwash) 1 part Viscous Lidocaine 2%; 1 part Maalox; 1 part diphenhydramine 12.5mg /30mL. Swish and spit.SABRA Refills: 0.  Misc Rx Supply (Blind Study medication) [place] [facility].      Medications Administered During Visit:              Medication Dose Route   lorazepam 0.5 mg IV Push          Major Tests and Procedures:  The following procedures and tests were performed during your ED visit.  COMMON PROCEDURES%>  COMMON PROCEDURES COMMENTS%>          Laboratory Orders  Name Status Details   CBCDIFF Completed Blood,  Stat, ST - Stat, 12/16/18 13:33:00 EDT, 12/16/18 13:33:00 EDT, Nurse collect, NORRIS-MD,  EVA RIGHTER, Print label Y/N   CMP Completed Blood, Stat, ST - Stat, 12/16/18 13:33:00 EDT, 12/16/18 13:33:00 EDT, Nurse collect, NORRIS-MD,  EVA RIGHTER, Print label Y/N   Trop T Completed Blood, Stat, ST - Stat, 12/16/18 13:33:00 EDT, 12/16/18 13:33:00 EDT, Nurse collect,  NORRIS-MD,  JUSTIN AARON, Print label Y/N   [place] Carbon Completed Blood, Stat, ST - Stat, Collected, 12/16/18 14:20:00 EDT [id number], 12/16/18 14:20:00 EDT, Nurse collect, Venous Draw, 12/16/18 14:25:00 EDT, SF CP Login, NORRIS-MD,  JUSTIN AARON, Print label Y/N, sf_lab_accession_3, sf_lab_accession_3, 1.8 mL Blue/*13...   XTube SST Completed Blood, Stat, ST - Stat, Collected, 12/16/18 14:20:00 EDT [id number], 12/16/18 14:20:00 EDT, Nurse collect, Venous Draw, 12/16/18 14:25:00 EDT, SF CP Login, NORRIS-MD,  JUSTIN AARON, Print label Y/N, sf_lab_accession_3, sf_lab_accession_3, 4 mL DDU/*[id number]...               Radiology Orders  Name Status Details   XR Chest 1 View Portable Completed 12/16/18 13:33:00 EDT, STAT 1 hour or less, Reason: Chest pain, Transport Mode: Portable, pp_set_radiology_subspecialty               Patient Care Orders  Name Status Details   Discharge Patient Ordered 12/16/18 15:17:00 EDT   ED Assessment Adult Completed 12/16/18 13:31:12 EDT, 12/16/18 13:31:12 EDT   ED Secondary Triage Ordered 12/16/18 13:31:13 EDT, 12/16/18 13:31:13 EDT   ED Triage Adult Completed 12/16/18 13:21:31 EDT, 12/16/18 13:21:31 EDT   NIBP/Pulse Ox Monitoring Completed 12/16/18 13:33:00 EDT, This message can only be seen by Nursing, it is not visible to Pharmacy, Laboratory, or Radiology., 12/16/18 13:33:00 EDT, 12/16/18 13:33:00 EDT, Once   Saline Lock Insert Completed 12/16/18 13:33:00 EDT, Once, 12/16/18 13:33:00 EDT       ---------------------------------------------------------------------------------------------------------------------  [facility]  [facility]) encourages you to self-enroll in the [facility] Patient Portal.  [facility] Patient Portal will allow you to manage your personal health information securely from your own electronic device now and in the future.  To begin your Patient Portal enrollment process, please visit [email or web address] Click on "Sign up now" under [facility].  If you find that you need additional assistance on the [facility] Patient Portal or need a copy of your medical records, please call the [facility] Medical Records Office at [phone].  Comment:

## 2021-05-01 DIAGNOSIS — D649 Anemia, unspecified: Secondary | ICD-10-CM

## 2021-05-01 HISTORY — DX: Anemia, unspecified: D64.9

## 2021-12-21 ENCOUNTER — Emergency Department (HOSPITAL_BASED_OUTPATIENT_CLINIC_OR_DEPARTMENT_OTHER)
Admission: EM | Admit: 2021-12-21 | Discharge: 2021-12-22 | Disposition: A | Discharge status: Home / Self Care | Payer: No Typology Code available for payment source | Attending: Emergency Medicine | Admitting: Emergency Medicine

## 2021-12-21 ENCOUNTER — Emergency Department (HOSPITAL_BASED_OUTPATIENT_CLINIC_OR_DEPARTMENT_OTHER): Payer: No Typology Code available for payment source

## 2021-12-21 ENCOUNTER — Encounter (HOSPITAL_BASED_OUTPATIENT_CLINIC_OR_DEPARTMENT_OTHER): Payer: Self-pay

## 2021-12-21 ENCOUNTER — Other Ambulatory Visit: Payer: Self-pay

## 2021-12-21 DIAGNOSIS — D259 Leiomyoma of uterus, unspecified: Secondary | ICD-10-CM | POA: Diagnosis not present

## 2021-12-21 DIAGNOSIS — D72829 Elevated white blood cell count, unspecified: Secondary | ICD-10-CM | POA: Diagnosis not present

## 2021-12-21 DIAGNOSIS — R102 Pelvic and perineal pain: Secondary | ICD-10-CM | POA: Diagnosis present

## 2021-12-21 HISTORY — DX: Endometriosis, unspecified: N80.9

## 2021-12-21 HISTORY — DX: Benign neoplasm of connective and other soft tissue, unspecified: D21.9

## 2021-12-21 LAB — CBC
HCT: 36.8 % (ref 36.0–46.0)
Hemoglobin: 11.1 g/dL — ABNORMAL LOW (ref 12.0–15.0)
MCH: 19.9 pg — ABNORMAL LOW (ref 26.0–34.0)
MCHC: 30.2 g/dL (ref 30.0–36.0)
MCV: 65.9 fL — ABNORMAL LOW (ref 80.0–100.0)
Platelets: 232 10*3/uL (ref 150–400)
RBC: 5.58 MIL/uL — ABNORMAL HIGH (ref 3.87–5.11)
RDW: 18 % — ABNORMAL HIGH (ref 11.5–15.5)
WBC: 10.9 10*3/uL — ABNORMAL HIGH (ref 4.0–10.5)
nRBC: 0 % (ref 0.0–0.2)

## 2021-12-21 LAB — COMPREHENSIVE METABOLIC PANEL
ALT: 12 U/L (ref 0–44)
AST: 13 U/L — ABNORMAL LOW (ref 15–41)
Albumin: 3.6 g/dL (ref 3.5–5.0)
Alkaline Phosphatase: 90 U/L (ref 38–126)
Anion gap: 6 (ref 5–15)
BUN: 21 mg/dL — ABNORMAL HIGH (ref 6–20)
CO2: 26 mmol/L (ref 22–32)
Calcium: 8.8 mg/dL — ABNORMAL LOW (ref 8.9–10.3)
Chloride: 104 mmol/L (ref 98–111)
Creatinine, Ser: 0.93 mg/dL (ref 0.44–1.00)
GFR, Estimated: >60 mL/min (ref >60)
Glucose, Bld: 101 mg/dL — ABNORMAL HIGH (ref 70–99)
Potassium: 3.4 mmol/L — ABNORMAL LOW (ref 3.5–5.1)
Sodium: 136 mmol/L (ref 135–145)
Total Bilirubin: 0.3 mg/dL (ref 0.3–1.2)
Total Protein: 7.4 g/dL (ref 6.5–8.1)

## 2021-12-21 LAB — URINALYSIS, ROUTINE W REFLEX MICROSCOPIC
Bilirubin Urine: NEGATIVE
Glucose, UA: NEGATIVE mg/dL
Ketones, ur: NEGATIVE mg/dL
Leukocytes,Ua: NEGATIVE
Nitrite: NEGATIVE
Protein, ur: NEGATIVE mg/dL
Specific Gravity, Urine: 1.025 (ref 1.005–1.030)
pH: 6.5 (ref 5.0–8.0)

## 2021-12-21 LAB — URINALYSIS, MICROSCOPIC (REFLEX): WBC, UA: NONE SEEN WBC/hpf (ref 0–5)

## 2021-12-21 LAB — LIPASE, BLOOD: Lipase: 33 U/L (ref 11–51)

## 2021-12-21 LAB — PREGNANCY, URINE: Preg Test, Ur: NEGATIVE

## 2021-12-21 MED ORDER — OXYCODONE-ACETAMINOPHEN 5-325 MG PO TABS
1.0000 | ORAL_TABLET | Freq: Once | ORAL | Status: AC
  Administered 2021-12-21: 1 via ORAL
  Filled 2021-12-21: qty 1

## 2021-12-21 MED ORDER — MORPHINE SULFATE (PF) 4 MG/ML IV SOLN
4.0000 mg | Freq: Once | INTRAVENOUS | Status: AC
  Administered 2021-12-21: 4 mg via INTRAVENOUS
  Filled 2021-12-21: qty 1

## 2021-12-21 MED ORDER — LACTATED RINGERS IV BOLUS
1000.0000 mL | Freq: Once | INTRAVENOUS | Status: AC
  Administered 2021-12-21: 1000 mL via INTRAVENOUS

## 2021-12-21 MED ORDER — OXYCODONE-ACETAMINOPHEN 5-325 MG PO TABS: 1.0000 | ORAL_TABLET | Freq: Four times a day (QID) | ORAL | 0 refills | Status: DC | PRN

## 2021-12-21 NOTE — Discharge Instructions (Addendum)
Your ultrasound today shows that your uterine fibroids have increased in size.  This is likely the cause of your severe pain.  I have given you referral to our OB/GYN here at [facility].  You can give them a call tomorrow and attempt to schedule follow-up appointment.  If this does not work out to the reassurance, you can revert back to your initial plan to establish care with a PCP after you get your insurance benefits to go through that avenue.  In the interim, continue treating as you have and I have also sent you in a few Percocet to use for severe breakthrough pain.

## 2021-12-21 NOTE — ED Notes (Signed)
Rx x 1 given  Written and verbal inst to pt  Verbalized an understanding  To home with family  

## 2021-12-21 NOTE — ED Triage Notes (Signed)
Patient states she is having issues with endometriosis/fibroids and cysts. Patient is having abd swelling. Patient states this happens every month and is going to be under evaluation for a hysterectomy.

## 2021-12-21 NOTE — ED Provider Notes (Signed)
[facility] EMERGENCY DEPARTMENT Provider Note   CSN: [id number] Arrival date & time: 12/21/21  1849     History  Chief Complaint  Patient presents with   Abdominal Pain   Edema    Jillian Vega is a 46 y.o. female with history of endometriosis/fibroids who presents to the emergency department for evaluation of severe pelvic pain and abdominal swelling.  Patient states that the abdominal distention and pain occur every month with her cycle, however it it has come a couple of days early this month and is extremely severe.  She is also endorsing significant left flank pain after she received a massage.  She states the massage therapist pressed down causing sudden pain of the left flank.  Patient states that she used to receive her care at [facility] in [facility] where she and her OB/GYN have been discussing whether or not she needs a hysterectomy to control her symptoms.  She states that she was holding off on the possible chance that she would want kids, however she has decided this year that she would like to proceed with a hysterectomy.  She recently started a new job and receiving insurance in 1 month.  She has already called a primary care office in the area to establish care who will be able to transfer her to an OB/GYN to determine treatment for her endometriosis with likely hysterectomy.  She states she typically manages her pain at home with ibuprofen 800 mg along with heating packs at night, warm baths and teas.  During her cycles, she passes large amounts of clots and pain can be so severe that she has difficulty walking.  She denies fever, chills, abdominal pain, nausea, vomiting, diarrhea, vaginal discharge, dysuria, hematuria.  She notes that she is scheduled to start her cycle today.   Abdominal Pain Associated symptoms: no dysuria, no fever, no hematuria, no nausea and no vomiting        Home Medications Prior to Admission medications   Medication  Sig Start Date End Date Taking? Authorizing Provider  oxyCODONE-acetaminophen (PERCOCET/ROXICET) 5-325 MG tablet Take 1 tablet by mouth every 6 (six) hours as needed for severe pain. 12/21/21  Yes Tonye Pearson, PA-C  acetaminophen (TYLENOL) 500 MG tablet Take 500 mg by mouth every 6 (six) hours as needed. Patient used this medication for her stomach pain.    [provider]  AMOXICILLIN PO Take 1 tablet by mouth 2 (two) times daily. Patient states that her cousin gave her this medication because she was suffering with a sore throat.  She took the medication for two days, using to tablets both days.    [provider]  aspirin-acetaminophen-caffeine (EXCEDRIN MIGRAINE) [phone] MG per tablet Take 1 tablet by mouth every 6 (six) hours as needed. Patient used this medication for a severe headache.    [provider]  diphenhydrAMINE (BENADRYL) 25 MG tablet Take 1 tablet (25 mg total) by mouth every 6 (six) hours. 12/23/12   Rancour, Annie Main, MD  HYDROcodone-acetaminophen (NORCO/VICODIN) 5-325 MG per tablet Take 1-2 tablets by mouth every 6 (six) hours as needed for pain. 04/28/12   Molpus, John, MD  ondansetron (ZOFRAN ODT) 8 MG disintegrating tablet Take 1 tablet (8 mg total) by mouth every 8 (eight) hours as needed for nausea. 04/28/12   Molpus, John, MD  predniSONE (DELTASONE) 50 MG tablet 1 tablet PO daily 12/23/12   Rancour, Annie Main, MD  promethazine (PHENERGAN) 25 MG tablet Take 1 tablet (25 mg  total) by mouth every 6 (six) hours as needed for nausea. 05/04/12   Drenda Freeze, MD      Allergies    Patient has no known allergies.    Review of Systems   Review of Systems  Constitutional:  Negative for fever.  Gastrointestinal:  Positive for abdominal pain. Negative for nausea and vomiting.  Genitourinary:  Positive for flank pain and pelvic pain. Negative for dysuria and hematuria.    Physical Exam Updated Vital Signs BP 134/87   Pulse 70   Temp 98.2 F  (36.8 C) (Oral)   Resp 18   Ht '5\' 4"'$  (1.626 m)   Wt 68 kg   LMP 11/22/2021   SpO2 100%   BMI 25.75 kg/m  Physical Exam Vitals and nursing note reviewed.  Constitutional:      General: She is not in acute distress.    Appearance: She is not ill-appearing.  HENT:     Head: Atraumatic.  Eyes:     Conjunctiva/sclera: Conjunctivae normal.  Cardiovascular:     Rate and Rhythm: Normal rate and regular rhythm.     Pulses: Normal pulses.     Heart sounds: No murmur heard. Pulmonary:     Effort: Pulmonary effort is normal. No respiratory distress.     Breath sounds: Normal breath sounds.  Abdominal:     General: Abdomen is flat. There is distension.     Palpations: Abdomen is soft.     Tenderness: There is abdominal tenderness. There is left CVA tenderness. There is no right CVA tenderness. Negative signs include Murphy's sign and McBurney's sign.     Comments: Abdomen is acutely distended but is otherwise soft and nontender.    Pelvic tenderness to palpation in a bandlike pattern  Musculoskeletal:        General: Normal range of motion.     Cervical back: Normal range of motion.  Skin:    General: Skin is warm and dry.     Capillary Refill: Capillary refill takes less than 2 seconds.  Neurological:     General: No focal deficit present.     Mental Status: She is alert.  Psychiatric:        Mood and Affect: Mood normal.     ED Results / Procedures / Treatments   Labs (all labs ordered are listed, but only abnormal results are displayed) Labs Reviewed  COMPREHENSIVE METABOLIC PANEL - Abnormal; Notable for the following components:      Result Value   Potassium 3.4 (*)    Glucose, Bld 101 (*)    BUN 21 (*)    Calcium 8.8 (*)    AST 13 (*)    All other components within normal limits  CBC - Abnormal; Notable for the following components:   WBC 10.9 (*)    RBC 5.58 (*)    Hemoglobin 11.1 (*)    MCV 65.9 (*)    MCH 19.9 (*)    RDW 18.0 (*)    All other components  within normal limits  URINALYSIS, ROUTINE W REFLEX MICROSCOPIC - Abnormal; Notable for the following components:   Hgb urine dipstick TRACE (*)    All other components within normal limits  URINALYSIS, MICROSCOPIC (REFLEX) - Abnormal; Notable for the following components:   Bacteria, UA RARE (*)    All other components within normal limits  LIPASE, BLOOD  PREGNANCY, URINE    EKG None  Radiology US PELVIC COMPLETE W TRANSVAGINAL AND TORSION R/O  Result Date:  12/21/2021 CLINICAL DATA:  Pelvic pain. EXAM: TRANSABDOMINAL AND TRANSVAGINAL ULTRASOUND OF PELVIS DOPPLER ULTRASOUND OF OVARIES TECHNIQUE: Both transabdominal and transvaginal ultrasound examinations of the pelvis were performed. Transabdominal technique was performed for global imaging of the pelvis including uterus, ovaries, adnexal regions, and pelvic cul-de-sac. It was necessary to proceed with endovaginal exam following the transabdominal exam to visualize the endometrium and ovaries. Color and duplex Doppler ultrasound was utilized to evaluate blood flow to the ovaries. COMPARISON:  Pelvic radiograph dated 04/29/2012. FINDINGS: Uterus Measurements: 8.2 x 5.8 x 6.8 cm = volume: 169 mL. The uterus is retroverted and demonstrates heterogeneous echotexture. Several uterine fibroids including a 3.1 x 2.1 x 2.3 cm (previously 1.5 x 1.4 x 1.7 cm) partially exophytic or subserosal anterior body and a 2.1 x 2.0 x 2.2 cm (previously 0.7 x 0.8 x 0.9 cm) anterior body intramural fibroid. Endometrium Thickness: 12 mm.  No focal abnormality visualized. Right ovary Measurements: 3.7 x 2.1 x 1.6 cm = volume: 6.3 mL. Normal appearance/no adnexal mass. Left ovary Measurements: 3.4 x 1.0 x 1.1 cm = volume: 1.9 mL. Normal appearance/no adnexal mass. Pulsed Doppler evaluation of both ovaries demonstrates normal low-resistance arterial and venous waveforms. Other findings No abnormal free fluid. IMPRESSION: 1. Heterogeneous uterus with several fibroids.  Interval increase in the size of the fibroids since the prior ultrasound. 2. Unremarkable endometrium and ovaries. Electronically Signed   By: Anner Crete M.D.   On: 12/21/2021 23:05    Procedures Procedures    Medications Ordered in ED Medications  oxyCODONE-acetaminophen (PERCOCET/ROXICET) 5-325 MG per tablet 1 tablet (has no administration in time range)  lactated ringers bolus 1,000 mL (1,000 mLs Intravenous New Bag/Given 12/21/21 2242)  morphine (PF) 4 MG/ML injection 4 mg (4 mg Intravenous Given 12/21/21 2155)    ED Course/ Medical Decision Making/ A&P                           Medical Decision Making Amount and/or Complexity of Data Reviewed Labs: ordered. Radiology: ordered.  Risk Prescription drug management.   Social determinants of health:  Social History   Socioeconomic History   Marital status: Single    Spouse name: Not on file   Number of children: Not on file   Years of education: Not on file   Highest education level: Not on file  Occupational History   Not on file  Tobacco Use   Smoking status: Some Days   Smokeless tobacco: Not on file  Substance and Sexual Activity   Alcohol use: Yes   Drug use: No   Sexual activity: Not on file  Other Topics Concern   Not on file  Social History Narrative   Not on file   Social Determinants of Health   Financial Resource Strain: Not on file  Food Insecurity: Not on file  Transportation Needs: Not on file  Physical Activity: Not on file  Stress: Not on file  Social Connections: Not on file  Intimate Partner Violence: Not on file     Initial impression:  This patient presents to the ED for concern of pelvic pain with abdominal distention, this involves an extensive number of treatment options, and is a complaint that carries with it a high risk of complications and morbidity.   Differentials include  pelvic inflammatory disease, ectopic pregnancy, appendicitis, urinary calculi, primary dysmenorrhea,  septic abortion, ruptured ovarian cyst or tumor, ovarian torsion, tubo-ovarian abscess, degeneration of fibroid, endometriosis, diverticulitis, cystitis.  Comorbidities affecting care:  Endometriosis, fibroids  Additional history obtained: none  Lab Tests  I Ordered, reviewed, and interpreted labs and EKG.  The pertinent results include:  CMP with out acute abnormality Mild leukocytosis of 10.9 UA normal  Imaging Studies ordered:  I ordered imaging studies including  Ultrasound identified uterine fibroids that have increased in size since prior imaging I independently visualized and interpreted imaging and I agree with the radiologist interpretation.   Medicines ordered and prescription drug management:  I ordered medication including: LR bolus Morphine '4mg'$    Percocet Reevaluation of the patient after these medicines showed that the patient improved I have reviewed the patients home medicines and have made adjustments as needed   ED Course/Re-evaluation: Presents in no acute distress and is nontoxic-appearing.  Vitals are without significant abnormality.  On exam, her abdomen is acutely distended although it is soft.  She has tenderness across the entire pelvic region.  Negative CVA tenderness bilaterally although she does have some tenderness of the left thoracic musculature.  I suspect that this is related to the massage that she received the other day.  I did consider pyelonephritis or nephrolithiasis, however symptoms are not consistent and there was no leukocytes and only trace blood noted on UA.  Patient's pelvic tenderness is concerning for possibly worsening fibroids or new ovarian torsion.  Labs were overall reassuring without significant findings.  Patient was given morphine and fluid bolus with proving and pain.  Ultrasound confirmed fibroids have increased in size since prior imaging. Patient was given referral to [facility].  She is to call  tomorrow for follow-up.  Provided prescription for pain management.  Patient expresses understanding is amenable to plan.  Disposition:  After consideration of the diagnostic results, physical exam, history and the patients response to treatment feel that the patent would benefit from discharge.   Uterine fibroid: Plan and management as described above. Discharged home in good condition.  Final Clinical Impression(s) / ED Diagnoses Final diagnoses:  Uterine leiomyoma, unspecified location    Rx / DC Orders ED Discharge Orders          Ordered    oxyCODONE-acetaminophen (PERCOCET/ROXICET) 5-325 MG tablet  Every 6 hours PRN        12/21/21 2334              Rodena Piety 12/21/21 2343    Gareth Morgan, MD 12/22/21 2352

## 2022-01-30 ENCOUNTER — Ambulatory Visit: Payer: BC Managed Care – PPO | Admitting: Nurse Practitioner

## 2022-01-30 ENCOUNTER — Other Ambulatory Visit (HOSPITAL_COMMUNITY)
Admission: RE | Admit: 2022-01-30 | Discharge: 2022-01-30 | Disposition: A | Discharge status: Home / Self Care | Payer: No Typology Code available for payment source | Source: Ambulatory Visit | Attending: Nurse Practitioner | Admitting: Nurse Practitioner

## 2022-01-30 ENCOUNTER — Encounter: Payer: Self-pay | Admitting: Nurse Practitioner

## 2022-01-30 VITALS — BP 122/80 | HR 76 | Ht 64.0 in | Wt 151.0 lb

## 2022-01-30 DIAGNOSIS — N809 Endometriosis, unspecified: Secondary | ICD-10-CM | POA: Diagnosis not present

## 2022-01-30 DIAGNOSIS — Z124 Encounter for screening for malignant neoplasm of cervix: Secondary | ICD-10-CM | POA: Insufficient documentation

## 2022-01-30 DIAGNOSIS — D259 Leiomyoma of uterus, unspecified: Secondary | ICD-10-CM | POA: Insufficient documentation

## 2022-01-30 DIAGNOSIS — N946 Dysmenorrhea, unspecified: Secondary | ICD-10-CM | POA: Diagnosis not present

## 2022-01-30 DIAGNOSIS — D251 Intramural leiomyoma of uterus: Secondary | ICD-10-CM | POA: Diagnosis not present

## 2022-01-30 DIAGNOSIS — N920 Excessive and frequent menstruation with regular cycle: Secondary | ICD-10-CM | POA: Diagnosis not present

## 2022-01-30 MED ORDER — MYFEMBREE 40-1-0.5 MG PO TABS: 1.0000 | ORAL_TABLET | Freq: Every day | ORAL | 5 refills | Status: DC

## 2022-01-30 NOTE — Progress Notes (Signed)
Acute Office Visit  Subjective:    Patient ID: Jillian Vega, female    DOB: 1975-08-17, 46 y.o.   MRN: [id number]   HPI 46 y.o. presents today for pelvic pain. Was seen in ER 12/21/2021 for pelvic pain. Pelvic ultrasound at that time showed several uterine fibroids, one subserosal measuring 3.1 x 2.1 x 2.3 cm (previously 1.5 x 1.4 x 1.7 in 2013) and intermural measuring 2.1 x 2.0 x 2.2 cm (previously 0.7 x 0.8 x 0.9 cm in 2013). H/O endometriosis. Has tried multiple hormonal contraceptives with minimal improvement. She was part of clinical trial in [place] for endometriosis and fibroid treatment. She was on medication x 1 year (thinks it was [place], had terrible hot flashes). Had to leave clinical trial due to abnormal pap. Has discussed hysterectomy in the past with previous OBGYN, but was hesitant at that time. Open to surgery now. Menses are very heavy and painful each month. She also has LLQ pain when she is not on menses sometimes. She misses work first 1-2 days due to symptoms. Reports abnormal pap in 2020, she believes it was HPV +, did not follow up due to moving. Quit smoking 2 years ago.    Menstrual history: Period Cycle (Days):  (28-35) Period Duration (Days): 3-7 Menstrual Flow:  (heavy w/ clots for first two days then lightens) Menstrual Control: Maxi pad (organic) Dysmenorrhea: (!) Severe Dysmenorrhea Symptoms: Cramping, Nausea, Headache (Low back pains)  Sexually active: Yes   Review of Systems  Constitutional: Negative.   Genitourinary:  Positive for menstrual problem and pelvic pain. Negative for vaginal discharge and vaginal pain.       Objective:    Physical Exam Constitutional:      Appearance: Normal appearance.  Genitourinary:    General: Normal vulva.     Vagina: Normal.     Cervix: Normal.     Uterus: Normal.      BP 122/80   Pulse 76   Ht '5\' 4"'$  (1.626 m)   Wt 151 lb (68.5 kg)   LMP 01/25/2022 (Exact Date)   SpO2 98%   BMI 25.92 kg/m   Wt Readings from Last 3 Encounters:  01/30/22 151 lb (68.5 kg)  12/21/21 150 lb (68 kg)  12/23/12 125 lb (56.7 kg)        Patient informed chaperone available to be present for breast and/or pelvic exam. Patient has requested no chaperone to be present. Patient has been advised what will be completed during breast and pelvic exam.   Assessment & Plan:   Problem List Items Addressed This Visit       Genitourinary   Uterine leiomyoma   Relevant Medications   Relugolix-Estradiol-Norethind (MYFEMBREE) 40-1-0.5 MG TABS     Other   Endometriosis determined by laparoscopy - Primary   Relevant Medications   Relugolix-Estradiol-Norethind (MYFEMBREE) 40-1-0.5 MG TABS   Other Visit Diagnoses     Menorrhagia with regular cycle       Relevant Medications   Relugolix-Estradiol-Norethind (MYFEMBREE) 40-1-0.5 MG TABS   Dysmenorrhea       Relevant Medications   Relugolix-Estradiol-Norethind (MYFEMBREE) 40-1-0.5 MG TABS   Encounter for Papanicolaou smear for cervical cancer screening       Relevant Orders   Cytology - PAP( [facility])      Plan: Discussed management options for endometriosis and uterine fibroids to include hormonal contraception, Orilissa/Myfembree/Oriahnn, and surgery. She has exhausted birth control options, had improvement in pain and bleeding on Oriahnn but had side effects, and  is considering hysterectomy. Will schedule OV with one of our surgeons to discuss surgical options. She would like to start Myfembree in the meantime. Aware of proper use, max duration of 2 years, side effects, and that it is not a contraceptive. All questions answered.      Tamela Gammon DNP, 11:31 AM 01/30/2022

## 2022-02-01 LAB — CYTOLOGY - PAP
Comment: NEGATIVE
Diagnosis: NEGATIVE
High risk HPV: NEGATIVE

## 2022-02-06 ENCOUNTER — Telehealth: Payer: Self-pay | Admitting: *Deleted

## 2022-02-06 NOTE — Telephone Encounter (Signed)
PA done via cover my meds for Myfembree, pending response from [employer].

## 2022-02-06 NOTE — Progress Notes (Deleted)
GYNECOLOGY  VISIT   HPI: 46 y.o.   Single  African American  female   G2P2 with Patient's last menstrual period was 01/25/2022 (exact date).   here for surgery consult.     GYNECOLOGIC HISTORY: Patient's last menstrual period was 01/25/2022 (exact date). Contraception:  *** Menopausal hormone therapy:  none  Last mammogram:  *** Last pap smear:   01/30/22 WNL Hr HPV Neg         OB History     Gravida  2   Para  2   Term      Preterm      AB      Living  2      SAB      IAB      Ectopic      Multiple      Live Births           Obstetric Comments  One child passed away             Patient Active Problem List   Diagnosis Date Noted   Endometriosis determined by laparoscopy 01/30/2022   Uterine leiomyoma 01/30/2022    Past Medical History:  Diagnosis Date   Anxiety    Endometriosis    Fibroids    Heart murmur    High cholesterol     Past Surgical History:  Procedure Laterality Date   COLPOSCOPY     LAPAROSCOPY     OVARIAN CYST SURGERY      Current Outpatient Medications  Medication Sig Dispense Refill   aspirin-acetaminophen-caffeine (EXCEDRIN MIGRAINE) 250-250-65 MG per tablet Take 1 tablet by mouth every 6 (six) hours as needed. Patient used this medication for a severe headache.     Ibuprofen (MOTRIN PO) Take by mouth.     ondansetron (ZOFRAN ODT) 8 MG disintegrating tablet Take 1 tablet (8 mg total) by mouth every 8 (eight) hours as needed for nausea. (Patient not taking: Reported on 01/30/2022) 10 tablet 0   oxyCODONE-acetaminophen (PERCOCET/ROXICET) 5-325 MG tablet Take 1 tablet by mouth every 6 (six) hours as needed for severe pain. 10 tablet 0   promethazine (PHENERGAN) 25 MG tablet Take 1 tablet (25 mg total) by mouth every 6 (six) hours as needed for nausea. (Patient not taking: Reported on 01/30/2022) 15 tablet 0   Relugolix-Estradiol-Norethind (MYFEMBREE) 40-1-0.5 MG TABS Take 1 tablet by mouth daily. 28 tablet 5   No current  facility-administered medications for this visit.     ALLERGIES: Other  Family History  Problem Relation Age of Onset   Osteoporosis Mother    Cancer Maternal Grandmother        ovarian    Social History   Socioeconomic History   Marital status: Single    Spouse name: Not on file   Number of children: Not on file   Years of education: Not on file   Highest education level: Not on file  Occupational History   Not on file  Tobacco Use   Smoking status: Former    Types: Cigarettes, E-cigarettes    Quit date: 2021    Years since quitting: 2.7   Smokeless tobacco: Not on file   Tobacco comments:    Quit tobacco-now vape user   Substance and Sexual Activity   Alcohol use: Yes    Comment: socially   Drug use: No   Sexual activity: Yes    Partners: Female    Birth control/protection: None  Other Topics Concern   Not on file  Social History Narrative   Not on file   Social Determinants of Health   Financial Resource Strain: Not on file  Food Insecurity: Not on file  Transportation Needs: Not on file  Physical Activity: Not on file  Stress: Not on file  Social Connections: Not on file  Intimate Partner Violence: Not on file    Review of Systems  PHYSICAL EXAMINATION:    LMP 01/25/2022 (Exact Date)     General appearance: alert, cooperative and appears stated age Head: Normocephalic, without obvious abnormality, atraumatic Neck: no adenopathy, supple, symmetrical, trachea midline and thyroid normal to inspection and palpation Lungs: clear to auscultation bilaterally Breasts: normal appearance, no masses or tenderness, No nipple retraction or dimpling, No nipple discharge or bleeding, No axillary or supraclavicular adenopathy Heart: regular rate and rhythm Abdomen: soft, non-tender, no masses,  no organomegaly Extremities: extremities normal, atraumatic, no cyanosis or edema Skin: Skin color, texture, turgor normal. No rashes or lesions Lymph nodes: Cervical,  supraclavicular, and axillary nodes normal. No abnormal inguinal nodes palpated Neurologic: Grossly normal  Pelvic: External genitalia:  no lesions              Urethra:  normal appearing urethra with no masses, tenderness or lesions              Bartholins and Skenes: normal                 Vagina: normal appearing vagina with normal color and discharge, no lesions              Cervix: no lesions                Bimanual Exam:  Uterus:  normal size, contour, position, consistency, mobility, non-tender              Adnexa: no mass, fullness, tenderness              Rectal exam: {yes no:[id number]}.  Confirms.              Anus:  normal sphincter tone, no lesions  Chaperone was present for exam:  ***  ASSESSMENT     PLAN     An After Visit Summary was printed and given to the patient.  ______ minutes face to face time of which over 50% was spent in counseling.

## 2022-02-06 NOTE — Telephone Encounter (Signed)
PA Case: [id number], Status: Approved, Coverage Starts on: 02/06/2022 12:00:00 AM, Coverage Ends on: 08/05/2022 12:00:00 AM.

## 2022-02-13 ENCOUNTER — Ambulatory Visit: Payer: BC Managed Care – PPO | Admitting: Obstetrics and Gynecology

## 2022-02-23 ENCOUNTER — Encounter (HOSPITAL_BASED_OUTPATIENT_CLINIC_OR_DEPARTMENT_OTHER): Payer: Self-pay

## 2022-02-23 ENCOUNTER — Emergency Department (HOSPITAL_BASED_OUTPATIENT_CLINIC_OR_DEPARTMENT_OTHER)
Admission: EM | Admit: 2022-02-23 | Discharge: 2022-02-24 | Disposition: A | Discharge status: Home / Self Care | Payer: No Typology Code available for payment source | Attending: Emergency Medicine | Admitting: Emergency Medicine

## 2022-02-23 ENCOUNTER — Emergency Department (HOSPITAL_BASED_OUTPATIENT_CLINIC_OR_DEPARTMENT_OTHER): Payer: No Typology Code available for payment source

## 2022-02-23 ENCOUNTER — Other Ambulatory Visit: Payer: Self-pay

## 2022-02-23 DIAGNOSIS — M545 Low back pain, unspecified: Secondary | ICD-10-CM | POA: Diagnosis not present

## 2022-02-23 DIAGNOSIS — N939 Abnormal uterine and vaginal bleeding, unspecified: Secondary | ICD-10-CM | POA: Insufficient documentation

## 2022-02-23 DIAGNOSIS — N920 Excessive and frequent menstruation with regular cycle: Secondary | ICD-10-CM | POA: Diagnosis not present

## 2022-02-23 DIAGNOSIS — R109 Unspecified abdominal pain: Secondary | ICD-10-CM | POA: Diagnosis not present

## 2022-02-23 LAB — CBC
HCT: 38 % (ref 36.0–46.0)
Hemoglobin: 11.3 g/dL — ABNORMAL LOW (ref 12.0–15.0)
MCH: 19.8 pg — ABNORMAL LOW (ref 26.0–34.0)
MCHC: 29.7 g/dL — ABNORMAL LOW (ref 30.0–36.0)
MCV: 66.5 fL — ABNORMAL LOW (ref 80.0–100.0)
Platelets: 242 10*3/uL (ref 150–400)
RBC: 5.71 MIL/uL — ABNORMAL HIGH (ref 3.87–5.11)
RDW: 18.3 % — ABNORMAL HIGH (ref 11.5–15.5)
WBC: 10.3 10*3/uL (ref 4.0–10.5)
nRBC: 0 % (ref 0.0–0.2)

## 2022-02-23 LAB — PREGNANCY, URINE: Preg Test, Ur: NEGATIVE

## 2022-02-23 MED ORDER — ONDANSETRON 4 MG PO TBDP
4.0000 mg | ORAL_TABLET | Freq: Once | ORAL | Status: AC
  Administered 2022-02-23: 4 mg via ORAL
  Filled 2022-02-23: qty 1

## 2022-02-23 MED ORDER — OXYCODONE-ACETAMINOPHEN 5-325 MG PO TABS: 1.0000 | ORAL_TABLET | ORAL | 0 refills | Status: DC | PRN

## 2022-02-23 MED ORDER — ONDANSETRON HCL 4 MG PO TABS: 4.0000 mg | ORAL_TABLET | Freq: Three times a day (TID) | ORAL | 0 refills | Status: AC | PRN

## 2022-02-23 MED ORDER — OXYCODONE-ACETAMINOPHEN 5-325 MG PO TABS
1.0000 | ORAL_TABLET | Freq: Once | ORAL | Status: AC
  Administered 2022-02-23: 1 via ORAL
  Filled 2022-02-23: qty 1

## 2022-02-23 NOTE — ED Provider Notes (Signed)
[facility] EMERGENCY DEPARTMENT Provider Note   CSN: [id number] Arrival date & time: 02/23/22  1811     History  Chief Complaint  Patient presents with   Vaginal Bleeding    Jillian Vega is a 46 y.o. female.  The history is provided by the patient and medical records. No language interpreter was used.  Vaginal Bleeding Quality:  Typical of menses Severity:  Moderate Onset quality:  Gradual Duration:  3 days Timing:  Constant Progression:  Unchanged Chronicity:  Recurrent Menstrual history:  Regular Possible pregnancy: no   Relieved by:  Nothing Worsened by:  Nothing Ineffective treatments:  None tried Associated symptoms: abdominal pain, back pain and nausea   Associated symptoms: no dizziness, no dysuria, no fatigue, no fever and no vaginal discharge   Risk factors: hx of endometriosis   Risk factors comment:  Fibroids      Home Medications Prior to Admission medications   Medication Sig Start Date End Date Taking? Authorizing Provider  aspirin-acetaminophen-caffeine (EXCEDRIN MIGRAINE) [phone] MG per tablet Take 1 tablet by mouth every 6 (six) hours as needed. Patient used this medication for a severe headache.    [provider]  Ibuprofen (MOTRIN PO) Take by mouth.    [provider]  ondansetron (ZOFRAN ODT) 8 MG disintegrating tablet Take 1 tablet (8 mg total) by mouth every 8 (eight) hours as needed for nausea. Patient not taking: Reported on 01/30/2022 04/28/12   Molpus, John, MD  oxyCODONE-acetaminophen (PERCOCET/ROXICET) 5-325 MG tablet Take 1 tablet by mouth every 6 (six) hours as needed for severe pain. 12/21/21   Tonye Pearson, PA-C  promethazine (PHENERGAN) 25 MG tablet Take 1 tablet (25 mg total) by mouth every 6 (six) hours as needed for nausea. Patient not taking: Reported on 01/30/2022 05/04/12   Drenda Freeze, MD  Relugolix-Estradiol-[facility]) 40-1-0.5 MG TABS Take 1 tablet by mouth daily. 01/30/22    Tamela Gammon, NP      Allergies    Other    Review of Systems   Review of Systems  Constitutional:  Negative for chills, fatigue and fever.  HENT:  Negative for congestion.   Respiratory:  Negative for cough, chest tightness, shortness of breath and wheezing.   Cardiovascular:  Negative for chest pain.  Gastrointestinal:  Positive for abdominal pain and nausea. Negative for constipation, diarrhea and vomiting.  Genitourinary:  Positive for vaginal bleeding. Negative for decreased urine volume, dysuria, flank pain, frequency and vaginal discharge.  Musculoskeletal:  Positive for back pain.  Skin:  Negative for rash and wound.  Neurological:  Negative for dizziness and headaches.  Psychiatric/Behavioral:  Negative for agitation.   All other systems reviewed and are negative.   Physical Exam Updated Vital Signs BP 127/83   Pulse 72   Temp 98.6 F (37 C) (Oral)   Resp 15   Ht '5\' 4"'$  (1.626 m)   Wt 64 kg   LMP 01/25/2022 (Exact Date)   SpO2 100%   BMI 24.20 kg/m  Physical Exam Vitals and nursing note reviewed.  Constitutional:      General: She is not in acute distress.    Appearance: She is well-developed. She is not ill-appearing, toxic-appearing or diaphoretic.  HENT:     Head: Normocephalic and atraumatic.     Nose: Nose normal.     Mouth/Throat:     Mouth: Mucous membranes are moist.  Eyes:     Conjunctiva/sclera: Conjunctivae normal.  Cardiovascular:     Rate and  Rhythm: Normal rate and regular rhythm.     Heart sounds: No murmur heard. Pulmonary:     Effort: Pulmonary effort is normal. No respiratory distress.     Breath sounds: Normal breath sounds. No wheezing, rhonchi or rales.  Chest:     Chest wall: No tenderness.  Abdominal:     General: Abdomen is flat.     Palpations: Abdomen is soft.     Tenderness: There is abdominal tenderness. There is no right CVA tenderness, left CVA tenderness, guarding or rebound.  Musculoskeletal:        General:  Tenderness present. No swelling, deformity or signs of injury.     Cervical back: Neck supple.     Lumbar back: Tenderness present.       Back:     Right lower leg: No edema.     Left lower leg: No edema.  Skin:    General: Skin is warm and dry.     Capillary Refill: Capillary refill takes less than 2 seconds.     Findings: No erythema.  Neurological:     General: No focal deficit present.     Mental Status: She is alert.  Psychiatric:        Mood and Affect: Mood normal.     ED Results / Procedures / Treatments   Labs (all labs ordered are listed, but only abnormal results are displayed) Labs Reviewed  CBC - Abnormal; Notable for the following components:      Result Value   RBC 5.71 (*)    Hemoglobin 11.3 (*)    MCV 66.5 (*)    MCH 19.8 (*)    MCHC 29.7 (*)    RDW 18.3 (*)    All other components within normal limits  PREGNANCY, URINE    EKG None  Radiology DG Lumbar Spine Complete  Result Date: 02/23/2022 CLINICAL DATA:  Severe abdominal pain and clots.  Back pain. EXAM: LUMBAR SPINE - COMPLETE 4+ VIEW COMPARISON:  None Available. FINDINGS: There is no evidence of lumbar spine fracture. There is mild retrolisthesis at L4-L5. Intervertebral disc spaces are maintained. IMPRESSION: Mild retrolisthesis at L4-L5. Electronically Signed   By: Brett Fairy M.D.   On: 02/23/2022 21:42    Procedures Procedures    Medications Ordered in ED Medications  oxyCODONE-acetaminophen (PERCOCET/ROXICET) 5-325 MG per tablet 1 tablet (1 tablet Oral Given 02/23/22 2333)  ondansetron (ZOFRAN-ODT) disintegrating tablet 4 mg (4 mg Oral Given 02/23/22 2333)    ED Course/ Medical Decision Making/ A&P                           Medical Decision Making Amount and/or Complexity of Data Reviewed Labs: ordered. Radiology: ordered.  Risk Prescription drug management.    Jillian Vega is a 46 y.o. female with a past medical history significant for known endometriosis and known  fibroids who presents with recurrent lower abdominal pain in the setting of menstrual cycle and vaginal bleeding.  According to patient, for the last 3 days she has had her menstrual cycle started up again and is having uncontrolled pain.  She reports this is similar to her last cycle.  She said that she has known fibroids and is scheduled to see her OB/GYN again to discuss possible hysterectomy.  She says that home medicines have not been working.  She denies other fevers, chills, chest pain, shortness of breath.  She reports some nausea with the severe pain but denies  any constipation or diarrhea.  Denies any other injuries but reports having some back pain today.  Denies any urinary changes and denies any trauma.  Denies any extremity symptoms in her arms or legs.  Denies any loss of bowel or bladder control.  On exam, lungs clear and chest nontender.  Abdomen is nontender in the upper abdomen but is tender to lower abdomen.  Bowel sounds were appreciated.  Patient had some paraspinal back tenderness in her low back but otherwise had no CVA tenderness.  No flank tenderness.  Exam otherwise unremarkable with reassuring pulses in lower extremity.  Intact sensation and strength in legs.  Patient had an x-ray in triage of her lumbar spine that did not show acute fracture.  We discussed some of the findings on it.  Her labs showed mild anemia but no evidence of white blood cell count elevation.  She is not pregnant.  Given her symptoms being similar to last menstrual cycle we have low suspicion that she has some new diverticulitis, appendicitis, or other different intra-abdominal pathology.  We offered pelvic exam and ultrasound however given the similar to prior, she would rather get better symptom control and then follow-up with her OB/GYN to discuss further management.  We will give a dose of pain medicine and nausea medicine and monitor and p.o. challenge her.  Patient agrees with plan of care.  Patient  will follow-up with her OB/GYN and discuss further management.  No other questions or concerns and was discharged in good condition.         Final Clinical Impression(s) / ED Diagnoses Final diagnoses:  Vaginal bleeding  Menorrhagia with regular cycle    Rx / DC Orders ED Discharge Orders          Ordered    oxyCODONE-acetaminophen (PERCOCET/ROXICET) 5-325 MG tablet  Every 4 hours PRN        02/23/22 2342    ondansetron (ZOFRAN) 4 MG tablet  Every 8 hours PRN        02/23/22 2342           Clinical Impression: 1. Vaginal bleeding   2. Menorrhagia with regular cycle     Disposition: Discharge  Condition: Good  I have discussed the results, Dx and Tx plan with the pt(& family if present). He/she/they expressed understanding and agree(s) with the plan. Discharge instructions discussed at great length. Strict return precautions discussed and pt &/or family have verbalized understanding of the instructions. No further questions at time of discharge.    New Prescriptions   ONDANSETRON (ZOFRAN) 4 MG TABLET    Take 1 tablet (4 mg total) by mouth every 8 (eight) hours as needed for nausea or vomiting.   OXYCODONE-ACETAMINOPHEN (PERCOCET/ROXICET) 5-325 MG TABLET    Take 1 tablet by mouth every 4 (four) hours as needed for severe pain.    Follow Up: your OBGYN     [facility] [address] [id number] IR [facility] [phone]       Shereece Wellborn, Gwenyth Allegra, MD 02/23/22 [phone]

## 2022-02-23 NOTE — Discharge Instructions (Signed)
Your history, exam, evaluation today are consistent with recurrent pain with your menstrual cycle likely due to the known uterine fibroids.  As your outpatient plan is not controlling her pain, we agreed to give a short course of stronger pain medicine nausea medicine and have you follow-up with your OB/GYN to discuss further management.  We offered pelvic exam and ultrasound today however given the known problems you have, you did not want to wait for that.  Your labs were otherwise reassuring and do not have evidence of significant new anemia.  We feel you are safe for discharge home given her otherwise well appearance however please use the symptom management medications and call your OB for follow-up.  If any symptoms change or worsen acutely, please return to the nearest emergency department.

## 2022-02-23 NOTE — ED Triage Notes (Signed)
Reports her menstrual cycle is causing severe abd pain and clots. Reports can't sleep on stomach reports 2-3 pads in 1 hr. Also complains of back pain.

## 2022-02-24 NOTE — ED Notes (Signed)
D/c paperwork reviewed with pt, including prescriptions and f/u care. Pt verbalized understanding, no questions or concerns at time of d/c. Pt wheeled to ED exit by visitor, NAD.

## 2022-02-28 ENCOUNTER — Telehealth: Payer: Self-pay | Admitting: *Deleted

## 2022-02-28 NOTE — Telephone Encounter (Signed)
Patient was transferred to triage voicemail stating she needs an office visit with Tiffany. Patient also said she was prescribed a medication and had to go to the ER. I called patient to speak with her however her  voicemail is full unable to leave message.

## 2022-03-01 ENCOUNTER — Encounter: Payer: Self-pay | Admitting: Obstetrics and Gynecology

## 2022-03-01 ENCOUNTER — Ambulatory Visit: Payer: BC Managed Care – PPO | Admitting: Obstetrics and Gynecology

## 2022-03-01 ENCOUNTER — Ambulatory Visit: Payer: BC Managed Care – PPO | Admitting: Nurse Practitioner

## 2022-03-01 ENCOUNTER — Encounter: Payer: Self-pay | Admitting: Nurse Practitioner

## 2022-03-01 VITALS — BP 120/84 | HR 66 | Ht 64.0 in | Wt 151.0 lb

## 2022-03-01 VITALS — BP 120/82 | Ht 64.0 in | Wt 151.0 lb

## 2022-03-01 DIAGNOSIS — N809 Endometriosis, unspecified: Secondary | ICD-10-CM | POA: Diagnosis not present

## 2022-03-01 DIAGNOSIS — R102 Pelvic and perineal pain: Secondary | ICD-10-CM

## 2022-03-01 DIAGNOSIS — Z8742 Personal history of other diseases of the female genital tract: Secondary | ICD-10-CM | POA: Diagnosis not present

## 2022-03-01 DIAGNOSIS — D500 Iron deficiency anemia secondary to blood loss (chronic): Secondary | ICD-10-CM

## 2022-03-01 DIAGNOSIS — D219 Benign neoplasm of connective and other soft tissue, unspecified: Secondary | ICD-10-CM | POA: Diagnosis not present

## 2022-03-01 DIAGNOSIS — G43109 Migraine with aura, not intractable, without status migrainosus: Secondary | ICD-10-CM | POA: Insufficient documentation

## 2022-03-01 DIAGNOSIS — N920 Excessive and frequent menstruation with regular cycle: Secondary | ICD-10-CM

## 2022-03-01 DIAGNOSIS — E7800 Pure hypercholesterolemia, unspecified: Secondary | ICD-10-CM | POA: Insufficient documentation

## 2022-03-01 MED ORDER — ACCRUFER 30 MG PO CAPS: 1.0000 | ORAL_CAPSULE | Freq: Two times a day (BID) | ORAL | 2 refills | Status: DC

## 2022-03-01 NOTE — Patient Instructions (Signed)
Total Laparoscopic Hysterectomy A total laparoscopic hysterectomy is a minimally invasive surgery to remove the uterus and cervix. The fallopian tubes and ovaries can also be removed during this surgery, if necessary. This procedure may be done to treat problems such as: Growths in the uterus (uterine fibroids) that are not cancer but cause symptoms. A condition that causes the lining of the uterus to grow in other areas (endometriosis). Problems with pelvic support. Cancer of the cervix, ovaries, uterus, or tissue that lines the uterus (endometrium). Excessive bleeding in the uterus. After this procedure, you will no longer be able to have a baby, and you will no longer have a menstrual period. Tell a health care provider about: Any allergies you have. All medicines you are taking, including vitamins, herbs, eye drops, creams, and over-the-counter medicines. Any problems you or family members have had with anesthetic medicines. Any blood disorders you have. Any surgeries you have had. Any medical conditions you have. Whether you are pregnant or may be pregnant. What are the risks? Generally, this is a safe procedure. However, problems may occur, including: Infection. Bleeding. Blood clots in the legs or lungs. Allergic reactions to medicines. Damage to nearby structures or organs. Having to change from this surgery to one in which a large incision is made in the abdomen (abdominal hysterectomy). What happens before the procedure? Staying hydrated Follow instructions from your health care provider about hydration, which may include: Up to 2 hours before the procedure - you may continue to drink clear liquids, such as water, clear fruit juice, black coffee, and plain tea.  Eating and drinking restrictions Follow instructions from your health care provider about eating and drinking, which may include: 8 hours before the procedure - stop eating heavy meals or foods, such as meat, fried  foods, or fatty foods. 6 hours before the procedure - stop eating light meals or foods, such as toast or cereal. 6 hours before the procedure - stop drinking milk or drinks that contain milk. 2 hours before the procedure - stop drinking clear liquids. Medicines Take over-the-counter and prescription medicines only as told by your health care provider. You may be asked to take medicine that helps you have a bowel movement (laxative) to prevent constipation. General instructions If you were asked to do bowel preparation before the procedure, follow instructions from your health care provider. This procedure can affect the way you feel about yourself. Talk with your health care provider about the physical and emotional changes hysterectomy may cause. Do not use any products that contain nicotine or tobacco for at least 4 weeks before the procedure. These products include cigarettes, chewing tobacco, and vaping devices, such as e-cigarettes. If you need help quitting, ask your health care provider. Plan to have a responsible adult take you home from the hospital or clinic. Plan to have a responsible adult care for you for the time you are told after you leave the hospital or clinic. This is important. Surgery safety Ask your health care provider: How your surgery site will be marked. What steps will be taken to help prevent infection. These may include: Removing hair at the surgery site. Washing skin with a germ-killing soap. Receiving antibiotic medicine. What happens during the procedure? An IV will be inserted into one of your veins. You will be given one or more of the following: A medicine to help you relax (sedative). A medicine to make you fall asleep (general anesthetic). A medicine to numb the area (local anesthetic). A medicine  that is injected into your spine to numb the area below and slightly above the injection site (spinal anesthetic). A medicine that is injected into an area of  your body to numb everything below the injection site (regional anesthetic). A gas will be used to inflate your abdomen. This will allow your surgeon to look inside your abdomen and do the surgery. Three or four small incisions will be made in your abdomen. A small device with a light (laparoscope) will be inserted into one of your incisions. Surgical instruments will be inserted through the other incisions in order to perform the procedure. Your uterus and cervix may be removed through your vagina or cut into small pieces and removed through the small incisions. Any other organs that need to be removed will also be removed this way. The gas will be released from inside your abdomen. Your incisions will be closed with stitches (sutures), skin glue, or adhesive strips. A bandage (dressing) may be placed over your incisions. The procedure may vary among health care providers and hospitals. What happens after the procedure? Your blood pressure, heart rate, breathing rate, and blood oxygen level will be monitored until you leave the hospital or clinic. You will be given medicine for pain as needed. You will be encouraged to walk as soon as possible. You will also use a device to help you breathe or do breathing exercises to keep your lungs clear. You may have to wear compression stockings. These stockings help to prevent blood clots and reduce swelling in your legs. You will need to wear a sanitary pad for vaginal discharge or bleeding. Summary Total laparoscopic hysterectomy is a procedure to remove your uterus, cervix, and sometimes the fallopian tubes and ovaries. This procedure can affect the way you feel about yourself. Talk with your health care provider about the physical and emotional changes hysterectomy may cause. After this procedure, you will no longer be able to have a baby, and you will no longer have a menstrual period. You will be given pain medicine to control discomfort after this  procedure. Plan to have a responsible adult take you home from the hospital or clinic. This information is not intended to replace advice given to you by your health care provider. Make sure you discuss any questions you have with your health care provider. Document Revised: 06/29/2021 Document Reviewed: 12/19/2019 Else[employer] Patient Education  [place].

## 2022-03-01 NOTE — Telephone Encounter (Signed)
Patient was seen on 03/01/22 for office visit.

## 2022-03-01 NOTE — Progress Notes (Unsigned)
GYNECOLOGY  VISIT   HPI: 46 y.o.   Single  African American  female   G2P2 with Patient's last menstrual period was 02/20/2022 (exact date).   here to discuss hysterectomy, patient complains of headaches, nausea and bloating during cycle.  Periods occur monthly and are painful.  Menses last 3 - 4 days.  Heavy flow for 2 - 3 days.  Changes a pad every 1 - 2 hours.  Takes oxycodone for pain from her recent ER visit. Motrin does not treat her pain.   [place] 12/21/21 - several fibroids:  3.1 x 2.1 x 2.3 cm, partially exophytic versus subserosal and 2.1 x 2.0 x 2.2 m anterior intramural fibroid.  EMS 12 mm with no focal abnormality.  Ovaries normal.   Hgb 11.3 on 02/23/22  Has ovulation pain.   Hx ovarian cyst rupture.  She feels like her uterus is inflamed and pain can be sharp.  Her pain is worse on her left side.   She endorses constipation.  No known rectal bleeding with menses.   No known hematuria during menses.   Took Myfembree and took it for 2 weeks and it causes left sided pain and difficulty sleeping.  She has tried birth control pills and Depo Provera.  She declines an IUD.   Laparoscopic laser of endometriosis of ovaries 1999.  Hx bleeding with sex.  Last time was 2 months ago.  She has vaginal digital penetration.  No use of instrumentation.  She has a female domestic partner.   She declines future childbearing.   GYNECOLOGIC HISTORY: Patient's last menstrual period was 02/20/2022 (exact date). Contraception:  myfembree Menopausal hormone therapy:  n/a Last mammogram:  n/a.  She will schedule. 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 Patient used this medication for a severe headache.     Ferric Maltol (ACCRUFER) 30 MG CAPS Take 1 capsule by mouth in the morning and at bedtime. Take without food (1 hour before or 2 hours after eating) 60 capsule 2   Ibuprofen (MOTRIN PO) Take by mouth.     ondansetron (ZOFRAN) 4 MG tablet Take 1 tablet (4 mg total)  by mouth every 8 (eight) hours as needed for nausea or vomiting. 15 tablet 0   oxyCODONE-acetaminophen (PERCOCET/ROXICET) 5-325 MG tablet Take 1 tablet by mouth every 6 (six) hours as needed for severe pain. 10 tablet 0   oxyCODONE-acetaminophen (PERCOCET/ROXICET) 5-325 MG tablet Take 1 tablet by mouth every 4 (four) hours as needed for severe pain. 15 tablet 0   promethazine (PHENERGAN) 25 MG tablet Take 1 tablet (25 mg total) by mouth every 6 (six) hours as needed for nausea. 15 tablet 0   Relugolix-Estradiol-Norethind (MYFEMBREE) 40-1-0.5 MG TABS Take 1 tablet by mouth daily. 28 tablet 5   No current facility-administered medications for this visit.     ALLERGIES: Other  Family History  Problem Relation Age of Onset   Osteoporosis Mother    Cancer Maternal Grandmother        ovarian    Social History   Socioeconomic History   Marital status: Single    Spouse name: Not on file   Number of children: Not on file   Years of education: Not on file   Highest education level: Not on file   Occupational History   Not on file  Tobacco Use   Smoking status: Former    Types: Cigarettes, E-cigarettes    Quit date: 2021    Years since quitting: 2.8   Smokeless tobacco: Not on file   Tobacco comments:    Quit tobacco-now vape user   Substance and Sexual Activity   Alcohol use: Yes    Comment: socially   Drug use: No   Sexual activity: Yes    Partners: Female    Birth control/protection: None  Other Topics Concern   Not on file  Social History Narrative   Not on file   Social Determinants of Health   Financial Resource Strain: Not on file  Food Insecurity: Not on file  Transportation Needs: Not on file  Physical Activity: Not on file  Stress: Not on file  Social Connections: Not on file  Intimate Partner Violence: Not on file    Review of Systems  PHYSICAL EXAMINATION:    Ht '5\' 4"'$  (1.626 m)   Wt 151 lb (68.5 kg)   LMP 02/20/2022 (Exact Date)   BMI 25.92 kg/m     General appearance: alert, cooperative and appears stated age Head: Normocephalic, without obvious abnormality, atraumatic Neck: no adenopathy, supple, symmetrical, trachea midline and thyroid normal to inspection and palpation Lungs: clear to auscultation bilaterally Breasts: normal appearance, no masses or tenderness, No nipple retraction or dimpling, No nipple discharge or bleeding, No axillary or supraclavicular adenopathy Heart: regular rate and rhythm Abdomen: soft, non-tender, no masses,  no organomegaly Extremities: extremities normal, atraumatic, no cyanosis or edema Skin: Skin color, texture, turgor normal. No rashes or lesions Lymph nodes: Cervical, supraclavicular, and axillary nodes normal. No abnormal inguinal nodes palpated Neurologic: Grossly normal  Pelvic: External genitalia:  no lesions              Urethra:  normal appearing urethra with no masses, tenderness or lesions              Bartholins and Skenes: normal                 Vagina: normal appearing vagina with normal  color and discharge, no lesions              Cervix: no lesions  Bimanual Exam:  Uterus:  normal size, contour, position, consistency, mobility, tenderness noted.              Adnexa: no mass, fullness, tenderness              Chaperone was present for exam:  Kimalexis, RMA.  ASSESSMENT  Uterine fibroids and history of endometriosis.  Menorrhagia.  Dysmenorrhea. Ovulation pain.  Hx ovarian cyst rupture. Mild anemia.  Migraine aura.   PLAN  We reviewed fibroids, pelvic pain, ovarian cysts and endometriosis.  We dicussed potential total laparoscopic hysterectomy with bilateral salpingo-oophorectomy. I reviewed risks, benefits, and alternatives.  Risks include but are not limited to bleeding, infection, damage to surrounding organs, pneumonia, reaction to anesthesia, DVT, PE, death, need for reoperation, hernia formation, neuropathy, continued pain, need to convert to a traditional laparotomy incision to complete the procedure, and menopausal change with ovarian removal leading changes such as vasomotor symptoms, increased risk of osteoporosis, and decreased libido.  Surgical expectations and recovery discussed.  Patient wishes to proceed.  We discussed Mirena IUD as an alternative, and she currently declines this option.  After chart review following her visit today, I would recommend she return for GC/CT/trichomonas testing which can be done through a voided urine specimen and then have an endometrial biopsy on a different day after the infection check results have finalized.     An After Visit Summary was printed and given to the patient.  34 min  total time was spent for this patient encounter, including preparation, face-to-face counseling with the patient, coordination of care, and documentation of the encounter.

## 2022-03-01 NOTE — Progress Notes (Signed)
   Acute Office Visit  Subjective:    Patient ID: Jillian Vega, female    DOB: 1976/01/09, 45 y.o.   MRN: [id number]   HPI 46 y.o. presents today for ER follow up. Seen in ER 02/23/22 for left sided abdominal pain during menses. Pain extended along all of left side and radiated to lower abdomen and back. Spinal xray showed mild retrolisthesis, otherwise normal, negative UPT, anemia on CBC. H/O endometriosis, started on Myfembree a couple of weeks ago but stopped using when she had the pain. Pain and bleeding have subsided. Has OV with Dr. Quincy Simmonds 11/9 to discuss surgical options as she is interested in hysterectomy but hoping to be seen before then. Uterine fibroids - see previous visit note from 01/30/22. Has exhausted all hormonal contraception. Pap 01/30/22 normal neg HPV.    Review of Systems  Constitutional: Negative.   Genitourinary:  Positive for menstrual problem.       Objective:    Physical Exam Constitutional:      Appearance: Normal appearance.   GU: not indicated  BP 120/84   Pulse 66   Ht '5\' 4"'$  (1.626 m) Comment: patient reported due to hair  Wt 151 lb (68.5 kg)   LMP 02/20/2022 (Exact Date)   SpO2 100%   BMI 25.92 kg/m  Wt Readings from Last 3 Encounters:  03/01/22 151 lb (68.5 kg)  02/23/22 141 lb (64 kg)  01/30/22 151 lb (68.5 kg)       Assessment & Plan:   Problem List Items Addressed This Visit   None Visit Diagnoses     Endometriosis determined by laparoscopy    -  Primary   Iron deficiency anemia due to chronic blood loss       Relevant Medications   Ferric Maltol (ACCRUFER) 30 MG CAPS      Plan: Recommend keeping appointment with Dr. Quincy Simmonds. We did again discuss option for [place] or restarting Myfembree in the meantime or as a bridge until surgery. Not interested at this time. Recommend Accrufer BID for anemia.      Tamela Gammon DNP, 2:10 PM 03/01/2022

## 2022-03-02 ENCOUNTER — Telehealth: Payer: Self-pay | Admitting: Obstetrics and Gynecology

## 2022-03-02 DIAGNOSIS — R102 Pelvic and perineal pain: Secondary | ICD-10-CM

## 2022-03-02 NOTE — Telephone Encounter (Signed)
Please contact patient in follow up to her visit with me.   I reviewed her chart further after our visit, and I do recommend two additional tests in preparation for her hysterectomy.   I would like for her to come in for a lab visit and give a voided specimen to be tested for GC/CT/trichomonas, which could be causes for pain.  For his urine specimen, I recommend she does not clean off with a wipe before she voids into the specimen cup.   She also needs an endometrial biopsy with me, which would need to be done about 5 days later, when the results should be back from the infection check.   Please schedule these visits.

## 2022-03-03 ENCOUNTER — Other Ambulatory Visit: Payer: BC Managed Care – PPO

## 2022-03-03 DIAGNOSIS — R102 Pelvic and perineal pain: Secondary | ICD-10-CM

## 2022-03-03 DIAGNOSIS — Z01812 Encounter for preprocedural laboratory examination: Secondary | ICD-10-CM | POA: Diagnosis not present

## 2022-03-03 NOTE — Telephone Encounter (Signed)
Order has been placed.

## 2022-03-03 NOTE — Telephone Encounter (Signed)
Patient coming today for urine.

## 2022-03-03 NOTE — Telephone Encounter (Signed)
Dr.Jertson see below patient informed with below. She would like to come today for urine,however no orders are in chart. I don't know the lab code to place orders. Can you enter lab orders and I will schedule lab appointment. Thanks!

## 2022-03-04 LAB — SURESWAB CT/NG/T. VAGINALIS
C. trachomatis RNA, TMA: NOT DETECTED
N. gonorrhoeae RNA, TMA: NOT DETECTED
Trichomonas vaginalis RNA: NOT DETECTED

## 2022-03-08 ENCOUNTER — Ambulatory Visit (INDEPENDENT_AMBULATORY_CARE_PROVIDER_SITE_OTHER): Payer: BC Managed Care – PPO | Admitting: Obstetrics and Gynecology

## 2022-03-08 ENCOUNTER — Encounter: Payer: Self-pay | Admitting: Obstetrics and Gynecology

## 2022-03-08 ENCOUNTER — Other Ambulatory Visit (HOSPITAL_COMMUNITY)
Admission: RE | Admit: 2022-03-08 | Discharge: 2022-03-08 | Disposition: A | Discharge status: Home / Self Care | Payer: BC Managed Care – PPO | Source: Ambulatory Visit | Attending: Obstetrics and Gynecology | Admitting: Obstetrics and Gynecology

## 2022-03-08 VITALS — BP 126/80 | HR 78 | Ht 64.0 in | Wt 151.0 lb

## 2022-03-08 DIAGNOSIS — R102 Pelvic and perineal pain: Secondary | ICD-10-CM | POA: Insufficient documentation

## 2022-03-08 DIAGNOSIS — N920 Excessive and frequent menstruation with regular cycle: Secondary | ICD-10-CM | POA: Insufficient documentation

## 2022-03-08 DIAGNOSIS — Z01812 Encounter for preprocedural laboratory examination: Secondary | ICD-10-CM | POA: Diagnosis not present

## 2022-03-08 DIAGNOSIS — Z9289 Personal history of other medical treatment: Secondary | ICD-10-CM

## 2022-03-08 HISTORY — DX: Personal history of other medical treatment: Z92.89

## 2022-03-08 HISTORY — PX: ENDOMETRIAL BIOPSY: SHX622

## 2022-03-08 LAB — PREGNANCY, URINE: Preg Test, Ur: NEGATIVE

## 2022-03-08 NOTE — Progress Notes (Unsigned)
GYNECOLOGY  VISIT   HPI: 46 y.o.   Single  Caucasian  female   G2P2 with Patient's last menstrual period was 02/20/2022 (exact date).   here for EMB for heavy periods and pelvic pain.  She has fibroids and a history of endometriosis treated laparoscopically in 1999.  Neg GC/CT/trich 03/03/22.  She wants hysterectomy as soon as possible.  She declines future childbearing.   UPT neg today.  GYNECOLOGIC HISTORY: Patient's last menstrual period was 02/20/2022 (exact date). Contraception:  n/a Menopausal hormone therapy:  n/a Last mammogram:   n/a.  She will schedule.  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 Patient used this medication for a severe headache.     Ferric Maltol (ACCRUFER) 30 MG CAPS Take 1 capsule by mouth in the morning and at bedtime. Take without food (1 hour before or 2 hours after eating) 60 capsule 2   Ibuprofen (MOTRIN PO) Take by mouth.     ondansetron (ZOFRAN) 4 MG tablet Take 1 tablet (4 mg total) by mouth every 8 (eight) hours as needed for  nausea or vomiting. 15 tablet 0   oxyCODONE-acetaminophen (PERCOCET/ROXICET) 5-325 MG tablet Take 1 tablet by mouth every 6 (six) hours as needed for severe pain. 10 tablet 0   oxyCODONE-acetaminophen (PERCOCET/ROXICET) 5-325 MG tablet Take 1 tablet by mouth every 4 (four) hours as needed for severe pain. 15 tablet 0   promethazine (PHENERGAN) 25 MG tablet Take 1 tablet (25 mg total) by mouth every 6 (six) hours as needed for nausea. 15 tablet 0   Relugolix-Estradiol-Norethind (MYFEMBREE) 40-1-0.5 MG TABS Take 1 tablet by mouth daily. 28 tablet 5   No current facility-administered medications for this visit.     ALLERGIES: Other  Family History  Problem Relation Age of Onset   Osteoporosis Mother    Cancer Maternal Grandmother        ovarian    Social History   Socioeconomic History   Marital status: Single    Spouse name: Not on file   Number of children: Not on file   Years of  education: Not on file   Highest education level: Not on file  Occupational History   Not on file  Tobacco Use   Smoking status: Former    Types: Cigarettes, E-cigarettes    Quit date: 2021    Years since quitting: 2.8   Smokeless tobacco: Not on file   Tobacco comments:    Quit tobacco-now vape user   Substance and Sexual Activity   Alcohol use: Yes    Comment: socially   Drug use: No   Sexual activity: Yes    Partners: Female    Birth control/protection: None  Other Topics Concern   Not on file  Social History Narrative   Not on file   Social Determinants of Health   Financial Resource Strain: Not on file  Food Insecurity: Not on file  Transportation Needs: Not on file  Physical Activity: Not on file  Stress: Not on file  Social Connections: Not on file  Intimate Partner Violence: Not on file    Review of Systems  Musculoskeletal:  Positive for back pain.    PHYSICAL EXAMINATION:    BP 126/80   Ht '5\' 4"'$  (1.626 m)   Wt 151 lb (68.5 kg)   LMP 02/20/2022 (Exact Date)   BMI  25.92 kg/m     General appearance: alert, cooperative and appears stated age  EMB Consent done.  Sterile prep with betadine.  Paracervical block 10 cc 1% lidocaine, lot [id number], exp 05/02/23.  EMB to 8 cm x 2.  Tissue to pathology. No complications.  Minimal EBL.   Chaperone was present for exam:  Gurshan Settlemire, RNA  ASSESSMENT  Menorrhagia with regular menses.  Pelvic pain.  Fibroids.  Hx endometriosis.   PLAN  FU EMB. Proceed with precert and scheduling of total laparoscopic hysterectomy with bilateral salpingectomy, possible bilateral oophorectomy, cystoscopy.   An After Visit Summary was printed and given to the patient.

## 2022-03-08 NOTE — Patient Instructions (Signed)
Endometrial Biopsy  An endometrial biopsy is a procedure to remove tissue samples from the endometrium, which is the lining of the uterus. The tissue that is removed can then be checked under a microscope for disease. This procedure is used to diagnose conditions such as endometrial cancer, endometrial tuberculosis, polyps, or other inflammatory conditions. This procedure may also be used to investigate uterine bleeding to determine where you are in your menstrual cycle or how your hormone levels are affecting the lining of the uterus. Tell a health care provider about: Any allergies you have. All medicines you are taking, including vitamins, herbs, eye drops, creams, and over-the-counter medicines. Any problems you or family members have had with anesthetic medicines. Any bleeding problems you have. Any surgeries you have had. Any medical conditions you have. Whether you are pregnant or may be pregnant. What are the risks? Your health care provider will talk with you about risks. These may include: Bleeding. Pelvic infection. Puncture of the wall of the uterus with the biopsy device (rare). Allergic reactions to medicines. What happens before the procedure? Keep a record of your menstrual cycles as told by your health care provider. You may need to schedule your procedure for a specific time in your cycle. Bring a sanitary pad in case you need to wear one after the procedure. Ask your health care provider about: Changing or stopping your regular medicines. These include any diabetes medicines or blood thinners you take. Taking medicines such as aspirin and ibuprofen. These medicines can thin your blood. Do not take these medicines unless your health care provider tells you to. Taking over-the-counter medicines, vitamins, herbs, and supplements. Plan to have someone take you home from the hospital or clinic. What happens during the procedure? You will lie on an exam table with your feet  and legs supported as in a pelvic exam. Your health care provider will insert an instrument into your vagina to see your cervix. Your cervix will be cleansed with an antiseptic solution. A medicine (local anesthetic) will be used to numb the cervix. A forceps instrument will be used to hold your cervix steady for the biopsy. A thin, rod-like instrument (uterine sound) will be inserted through your cervix to determine the length of your uterus and the location where the biopsy sample will be removed. A thin, flexible tube (catheter) will be inserted through your cervix and into the uterus. The catheter will be used to collect the biopsy sample from your endometrial tissue. The tube and instruments will be removed, and the tissue sample will be sent to a lab for examination. The procedure may vary among health care providers and hospitals. What happens after the procedure? Your blood pressure, heart rate, breathing rate, and blood oxygen level will be monitored until you leave the hospital or clinic. It is up to you to get the results of your procedure. Ask your health care provider, or the department that is doing the procedure, when your results will be ready. Summary An endometrial biopsy is a procedure to remove tissue samples from the endometrium, which is the lining of the uterus. This procedure is used to diagnose conditions such as endometrial cancer, endometrial tuberculosis, polyps, or other inflammatory conditions. It is up to you to get the results of your procedure. Ask your health care provider, or the department that is doing the procedure, when your results will be ready. This information is not intended to replace advice given to you by your health care provider. Make sure you   discuss any questions you have with your health care provider. Document Revised: 08/02/2021 Document Reviewed: 08/02/2021 Elsevier Patient Education  2023 [employer]  

## 2022-03-09 ENCOUNTER — Ambulatory Visit: Payer: BC Managed Care – PPO | Admitting: Obstetrics and Gynecology

## 2022-03-09 ENCOUNTER — Telehealth: Payer: Self-pay | Admitting: Obstetrics and Gynecology

## 2022-03-09 NOTE — Telephone Encounter (Signed)
Please precert and schedule total laparoscopic hysterectomy with bilateral salpingectomy, possible bilateral oophorectomy, and cystoscopy at [facility] this year.   Patient has menorrhagia with regular menses, pelvic pain, fibroids, and history of endometriosis.   Time needed is 2.5 hours.   Assistant needed.  Brief preop needed.

## 2022-03-10 LAB — SURGICAL PATHOLOGY

## 2022-03-21 NOTE — Progress Notes (Signed)
GYNECOLOGY  VISIT   HPI: 46 y.o.   Single  African American  female   G2P2 with Patient's last menstrual period was 02/20/2022 (exact date).   here for   pre-op consult. Taking otc iron once daily.  She is currently dealing with allergies and has tested negative for Covid.   Patient desires hysterectomy and declines medical therapy. She also declines future childbearing.  She has fibroids and pelvic pain with a hx of endometriosis.  She has a history of laparoscopic laser of endometriosis of the ovaries done in the 1990s.    Her endometrial biopsy 03/08/22 showed benign proliferative endometrium.  She had testing negative for GC/CT/trich on 03/03/22.  She stopped the Myfembree.   Last hgb was 11.2 on 02/23/22.  Her ultrasound is noted below:  CLINICAL DATA:  Pelvic pain.   EXAM: TRANSABDOMINAL AND TRANSVAGINAL ULTRASOUND OF PELVIS   DOPPLER ULTRASOUND OF OVARIES   TECHNIQUE: Both transabdominal and transvaginal ultrasound examinations of the pelvis were performed. Transabdominal technique was performed for global imaging of the pelvis including uterus, ovaries, adnexal regions, and pelvic cul-de-sac.   It was necessary to proceed with endovaginal exam following the transabdominal exam to visualize the endometrium and ovaries. Color and duplex Doppler ultrasound was utilized to evaluate blood flow to the ovaries.   COMPARISON:  Pelvic radiograph dated 04/29/2012.   FINDINGS: Uterus   Measurements: 8.2 x 5.8 x 6.8 cm = volume: 169 mL. The uterus is retroverted and demonstrates heterogeneous echotexture. Several uterine fibroids including a 3.1 x 2.1 x 2.3 cm (previously 1.5 x 1.4 x 1.7 cm) partially exophytic or subserosal anterior body and a 2.1 x 2.0 x 2.2 cm (previously 0.7 x 0.8 x 0.9 cm) anterior body intramural fibroid.   Endometrium   Thickness: 12 mm.  No focal abnormality visualized.   Right ovary   Measurements: 3.7 x 2.1 x 1.6 cm = volume: 6.3 mL.  Normal appearance/no adnexal mass.   Left ovary   Measurements: 3.4 x 1.0 x 1.1 cm = volume: 1.9 mL. Normal appearance/no adnexal mass.   Pulsed Doppler evaluation of both ovaries demonstrates normal low-resistance arterial and venous waveforms.   Other findings   No abnormal free fluid.   IMPRESSION: 1. Heterogeneous uterus with several fibroids. Interval increase in the size of the fibroids since the prior ultrasound. 2. Unremarkable endometrium and ovaries.     Electronically Signed   By: Anner Crete M.D.   On: 12/21/2021 23:05   GYNECOLOGIC HISTORY: Patient's last menstrual period was 02/20/2022 (exact date). Period is four days late. 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 Patient used this medication for a severe headache.     Ferric Maltol (ACCRUFER) 30 MG CAPS Take 1 capsule by mouth in the morning and at bedtime. Take without food (1 hour before or 2 hours after eating) 60  capsule 2   Ibuprofen (MOTRIN PO) Take by mouth.     ondansetron (ZOFRAN) 4 MG tablet Take 1 tablet (4 mg total) by mouth every 8 (eight) hours as needed for nausea or vomiting. 15 tablet 0   oxyCODONE-acetaminophen (PERCOCET/ROXICET) 5-325 MG tablet Take 1 tablet by mouth every 6 (six) hours as needed for severe pain. 10 tablet 0   oxyCODONE-acetaminophen (PERCOCET/ROXICET) 5-325 MG tablet Take 1 tablet by mouth every 4 (four) hours as needed for severe pain. 15 tablet 0   promethazine (PHENERGAN) 25 MG tablet Take 1 tablet (25 mg total) by mouth every 6 (six) hours as needed for nausea. 15 tablet 0   Relugolix-Estradiol-Norethind (MYFEMBREE) 40-1-0.5 MG TABS Take 1 tablet by mouth daily. 28 tablet 5   No current facility-administered medications for this visit.     ALLERGIES: Other  Family History  Problem Relation Age of Onset   Osteoporosis Mother    Cancer Maternal Grandmother        ovarian    Social History   Socioeconomic History   Marital status: Single    Spouse name: Not on file   Number of children: Not on file   Years of education: Not on file   Highest education level: Not on file  Occupational History   Not on file  Tobacco Use   Smoking status: Former    Types: Cigarettes, E-cigarettes    Quit date: 2021    Years since quitting: 2.9   Smokeless tobacco: Never   Tobacco comments:    Quit tobacco-now vape user.  Substance and Sexual Activity   Alcohol use: Yes    Comment: socially   Drug use: No   Sexual activity: Yes    Partners: Female    Birth control/protection: None  Other Topics Concern   Not on file  Social History Narrative   Not on file   Social Determinants of Health   Financial Resource Strain: Not on file  Food Insecurity: Not on file  Transportation Needs: Not on file  Physical Activity: Not on file  Stress: Not on file  Social Connections: Not on file  Intimate Partner Violence: Not on file    Review of Systems  All other  systems reviewed and are negative.   PHYSICAL EXAMINATION:    BP (!) 135/94 (BP Location: Left Arm, Patient Position: Sitting, Cuff Size: Normal)   Ht '5\' 4"'$  (1.626 m)   Wt 159 lb (72.1 kg)   LMP 02/20/2022 (Exact Date)   BMI 27.29 kg/m     General appearance: alert, cooperative and appears stated age Head: Normocephalic, without obvious abnormality, atraumatic Neck: no adenopathy, supple, symmetrical, trachea midline and thyroid normal to inspection and palpation Lungs:  clear to auscultation bilaterally Heart: regular rate and rhythm Abdomen: soft, non-tender, no masses,  no organomegaly Extremities: extremities normal, atraumatic, no cyanosis or edema Skin: Skin color, texture, turgor normal. No rashes or lesions Lymph nodes: Cervical, supraclavicular, and axillary nodes normal. No abnormal inguinal nodes palpated Neurologic: Grossly normal  Pelvic: External genitalia:  no lesions              Urethra:  normal appearing urethra with no masses, tenderness or lesions              Bartholins and Skenes: normal                 Vagina: normal appearing vagina with normal color and discharge, no lesions              Cervix: no lesions                Bimanual Exam:  Uterus:  normal size, contour, position, consistency, mobility, non-tender              Adnexa: no mass, fullness, tenderness           Chaperone was present for exam:  Raquel Sarna  ASSESSMENT  Symptomatic uterine fibroids and history of endometriosis.  Menorrhagia.  Dysmenorrhea. Ovulation pain.  Hx ovarian cyst rupture. Mild anemia.  Migraine aura.  FH ovarian cancer in MGM.  E-Vaping.   PLAN  Will proceed with total laparoscopic hysterectomy with bilateral salpingectomy, possible bilateral oophorectomy, and cystoscopy.  Risks, benefits, and alternatives have been discussed with the patient who wishes to proceed.  ACOG HO on hysterectomy to patient.  She has presented FMLA papers to be filled out and I  recommended time out of work is 6 weeks.  She works at a call center as a Associate Professor.    An After Visit Summary was printed and given to the patient.

## 2022-03-21 NOTE — Telephone Encounter (Signed)
Spoke with patient. Reviewed surgery dates. Patient request to proceed with surgery on 04/11/22.  Advised patient I will forward to business office for return call. I will return call once surgery date and time confirmed. Patient verbalizes understanding and is agreeable.   Surgery request sent.   Routing to [place] for benefits

## 2022-03-27 ENCOUNTER — Encounter: Payer: Self-pay | Admitting: Obstetrics and Gynecology

## 2022-03-27 ENCOUNTER — Ambulatory Visit (INDEPENDENT_AMBULATORY_CARE_PROVIDER_SITE_OTHER): Payer: BC Managed Care – PPO | Admitting: Obstetrics and Gynecology

## 2022-03-27 VITALS — BP 135/94 | Ht 64.0 in | Wt 159.0 lb

## 2022-03-27 DIAGNOSIS — N946 Dysmenorrhea, unspecified: Secondary | ICD-10-CM | POA: Diagnosis not present

## 2022-03-27 DIAGNOSIS — D219 Benign neoplasm of connective and other soft tissue, unspecified: Secondary | ICD-10-CM | POA: Diagnosis not present

## 2022-03-27 NOTE — Telephone Encounter (Signed)
Spoke with patient. Surgery date request confirmed.  Advised surgery is scheduled for 04/11/22, [place] [facility].  Surgery instruction sheet and hospital brochure reviewed, printed copy provided to patient.  Patient verbalizes understanding and is agreeable.  Routing to provider. Encounter closed.

## 2022-03-30 ENCOUNTER — Encounter (HOSPITAL_BASED_OUTPATIENT_CLINIC_OR_DEPARTMENT_OTHER): Payer: Self-pay | Admitting: Obstetrics and Gynecology

## 2022-04-03 ENCOUNTER — Other Ambulatory Visit: Payer: Self-pay

## 2022-04-03 ENCOUNTER — Encounter (HOSPITAL_BASED_OUTPATIENT_CLINIC_OR_DEPARTMENT_OTHER): Payer: Self-pay | Admitting: Obstetrics and Gynecology

## 2022-04-03 DIAGNOSIS — D259 Leiomyoma of uterus, unspecified: Secondary | ICD-10-CM | POA: Diagnosis not present

## 2022-04-03 DIAGNOSIS — N946 Dysmenorrhea, unspecified: Secondary | ICD-10-CM | POA: Diagnosis not present

## 2022-04-03 DIAGNOSIS — R102 Pelvic and perineal pain: Secondary | ICD-10-CM | POA: Diagnosis not present

## 2022-04-03 DIAGNOSIS — Z01812 Encounter for preprocedural laboratory examination: Secondary | ICD-10-CM | POA: Diagnosis not present

## 2022-04-03 NOTE — H&P (Signed)
Jillian Cobbs, MD Physician Gynecology Progress Notes    Signed Encounter Date:  03/27/2022   Signed      GYNECOLOGY  VISIT   HPI: 46 y.o.   Single  African American  female   G2P2 with Patient's last menstrual period was 02/20/2022 (exact date).   here for   pre-op consult. Taking otc iron once daily.  She is currently dealing with allergies and has tested negative for Covid.    Patient desires hysterectomy and declines medical therapy. She also declines future childbearing.  She has fibroids and pelvic pain with a hx of endometriosis.  She has a history of laparoscopic laser of endometriosis of the ovaries done in the 1990s.     Her endometrial biopsy 03/08/22 showed benign proliferative endometrium.  She had testing negative for GC/CT/trich on 03/03/22.   She stopped the Myfembree.    Last hgb was 11.2 on 02/23/22.   Her ultrasound is noted below:   CLINICAL DATA:  Pelvic pain.   EXAM: TRANSABDOMINAL AND TRANSVAGINAL ULTRASOUND OF PELVIS   DOPPLER ULTRASOUND OF OVARIES   TECHNIQUE: Both transabdominal and transvaginal ultrasound examinations of the pelvis were performed. Transabdominal technique was performed for global imaging of the pelvis including uterus, ovaries, adnexal regions, and pelvic cul-de-sac.   It was necessary to proceed with endovaginal exam following the transabdominal exam to visualize the endometrium and ovaries. Color and duplex Doppler ultrasound was utilized to evaluate blood flow to the ovaries.   COMPARISON:  Pelvic radiograph dated 04/29/2012.   FINDINGS: Uterus   Measurements: 8.2 x 5.8 x 6.8 cm = volume: 169 mL. The uterus is retroverted and demonstrates heterogeneous echotexture. Several uterine fibroids including a 3.1 x 2.1 x 2.3 cm (previously 1.5 x 1.4 x 1.7 cm) partially exophytic or subserosal anterior body and a 2.1 x 2.0 x 2.2 cm (previously 0.7 x 0.8 x 0.9 cm) anterior body intramural fibroid.    Endometrium   Thickness: 12 mm.  No focal abnormality visualized.   Right ovary   Measurements: 3.7 x 2.1 x 1.6 cm = volume: 6.3 mL. Normal appearance/no adnexal mass.   Left ovary   Measurements: 3.4 x 1.0 x 1.1 cm = volume: 1.9 mL. Normal appearance/no adnexal mass.   Pulsed Doppler evaluation of both ovaries demonstrates normal low-resistance arterial and venous waveforms.   Other findings   No abnormal free fluid.   IMPRESSION: 1. Heterogeneous uterus with several fibroids. Interval increase in the size of the fibroids since the prior ultrasound. 2. Unremarkable endometrium and ovaries.     Electronically Signed   By: Anner Crete M.D.   On: 12/21/2021 23:05     GYNECOLOGIC HISTORY: Patient's last menstrual period was 02/20/2022 (exact date). Period is four days late. 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 Patient used this  medication for a severe headache.       Ferric Maltol (ACCRUFER) 30 MG CAPS Take 1 capsule by mouth in the morning and at bedtime. Take without food (1 hour before or 2 hours after eating) 60 capsule 2   Ibuprofen (MOTRIN PO) Take by mouth.       ondansetron (ZOFRAN) 4 MG tablet Take 1 tablet (4 mg total) by mouth every 8 (eight) hours as needed for nausea or vomiting. 15 tablet 0   oxyCODONE-acetaminophen (PERCOCET/ROXICET) 5-325 MG tablet Take 1 tablet by mouth every 6 (six) hours as needed for severe pain. 10 tablet 0   oxyCODONE-acetaminophen (PERCOCET/ROXICET) 5-325 MG tablet Take 1 tablet by mouth every 4 (four) hours as needed for severe pain. 15 tablet 0   promethazine (PHENERGAN) 25 MG tablet Take 1 tablet (25 mg total) by mouth every 6 (six) hours as needed for nausea. 15 tablet 0   Relugolix-Estradiol-Norethind (MYFEMBREE) 40-1-0.5 MG TABS Take 1 tablet by mouth daily. 28 tablet 5    No current facility-administered medications for this visit.      ALLERGIES: Other        Family History  Problem Relation Age of Onset   Osteoporosis Mother     Cancer Maternal Grandmother          ovarian      Social History         Socioeconomic History   Marital status: Single      Spouse name: Not on file   Number of children: Not on file   Years of education: Not on file   Highest education level: Not on file  Occupational History   Not on file  Tobacco Use   Smoking status: Former      Types: Cigarettes, E-cigarettes      Quit date: 2021      Years since quitting: 2.9   Smokeless tobacco: Never   Tobacco comments:      Quit tobacco-now vape user.  Substance and Sexual Activity   Alcohol use: Yes      Comment: socially   Drug use: No   Sexual activity: Yes      Partners: Female      Birth control/protection: None  Other Topics Concern   Not on file  Social History Narrative   Not on file    Social Determinants of Health    Financial Resource Strain: Not on file   Food Insecurity: Not on file  Transportation Needs: Not on file  Physical Activity: Not on file  Stress: Not on file  Social Connections: Not on file  Intimate Partner Violence: Not on file      Review of Systems  All other systems reviewed and are negative.     PHYSICAL EXAMINATION:     BP (!) 135/94 (BP Location: Left Arm, Patient Position: Sitting, Cuff Size: Normal)   Ht '5\' 4"'$  (1.626 m)   Wt 159 lb (72.1 kg)   LMP 02/20/2022 (Exact Date)   BMI 27.29 kg/m     General appearance: alert, cooperative and appears stated age Head: Normocephalic, without obvious abnormality, atraumatic Neck: no adenopathy, supple, symmetrical, trachea midline and thyroid normal to inspection and palpation Lungs: clear to auscultation bilaterally Heart: regular rate and rhythm Abdomen: soft, non-tender, no masses,  no organomegaly Extremities: extremities normal, atraumatic, no cyanosis or edema Skin: Skin color, texture, turgor normal. No rashes or lesions Lymph nodes: Cervical, supraclavicular, and axillary nodes normal. No abnormal inguinal nodes palpated Neurologic: Grossly normal   Pelvic: External genitalia:  no lesions              Urethra:  normal appearing urethra with no masses, tenderness or lesions              Bartholins and Skenes: normal                 Vagina: normal appearing vagina with normal color and discharge, no lesions              Cervix: no lesions                Bimanual Exam:  Uterus:  normal size, contour, position, consistency, mobility, non-tender              Adnexa: no mass, fullness, tenderness            Chaperone was present for exam:  Raquel Sarna   ASSESSMENT   Symptomatic uterine fibroids and history of endometriosis.  Menorrhagia.  Dysmenorrhea. Ovulation pain.  Hx ovarian cyst rupture. Mild anemia.  Migraine aura.  FH ovarian cancer in MGM.  E-Vaping.    PLAN   Will proceed with total laparoscopic hysterectomy with bilateral salpingectomy,  possible bilateral oophorectomy, and cystoscopy.  Risks, benefits, and alternatives have been discussed with the patient who wishes to proceed.  ACOG HO on hysterectomy to patient.  She has presented FMLA papers to be filled out and I recommended time out of work is 6 weeks.  She works at a call center as a Associate Professor.    An After Visit Summary was printed and given to the patient.              Electronically signed by Jillian Cobbs, MD at 03/27/2022  6:51 PM

## 2022-04-03 NOTE — Progress Notes (Signed)
Your procedure is scheduled on Tuesday, 04/11/22.  Report to [facility]   Call this number if you have problems the morning of surgery  :[phone].   OUR ADDRESS IS [address].  WE ARE LOCATED IN [facility].  PLEASE BRING YOUR INSURANCE CARD AND PHOTO ID DAY OF SURGERY.  ONLY 2 PEOPLE ARE ALLOWED IN  WAITING  ROOM.                                      REMEMBER:  DO NOT EAT FOOD, CANDY GUM OR MINTS  AFTER MIDNIGHT THE NIGHT BEFORE YOUR SURGERY . YOU MAY HAVE CLEAR LIQUIDS FROM MIDNIGHT THE NIGHT BEFORE YOUR SURGERY UNTIL  4:30 AM. NO CLEAR LIQUIDS AFTER   4:30 AM DAY OF SURGERY.  YOU MAY  BRUSH YOUR TEETH MORNING OF SURGERY AND RINSE YOUR MOUTH OUT, NO CHEWING GUM CANDY OR MINTS.     CLEAR LIQUID DIET   Foods Allowed                                                                     Foods Excluded  Coffee and tea, regular and decaf                             liquids that you cannot  Plain Jell-O                                                                   see through such as: Fruit ices (not with fruit pulp)                                     milk, soups, orange juice  Plain  Popsicles                                    All solid food Carbonated beverages, regular and diet                                    Cranberry, grape and apple juices Sports drinks like Gatorade _____________________________________________________________________     TAKE ONLY THESE MEDICATIONS MORNING OF SURGERY: Zofran if needed    UP TO 4 VISITORS  MAY VISIT IN THE EXTENDED RECOVERY ROOM UNTIL 800 PM ONLY.  ONE  VISITOR AGE 44 AND OVER MAY SPEND THE NIGHT AND MUST BE IN EXTENDED RECOVERY ROOM NO LATER THAN 800 PM . YOUR DISCHARGE TIME AFTER YOU SPEND THE NIGHT IS 900 AM THE MORNING AFTER YOUR SURGERY.  YOU MAY PACK A SMALL OVERNIGHT BAG WITH TOILETRIES FOR YOUR  OVERNIGHT STAY IF YOU WISH.  YOUR PRESCRIPTION MEDICATIONS WILL  BE PROVIDED DURING [place].                                      DO NOT WEAR JEWERLY, MAKE UP. DO NOT WEAR LOTIONS, POWDERS, PERFUMES OR NAIL POLISH ON YOUR FINGERNAILS. TOENAIL POLISH IS OK TO WEAR. DO NOT SHAVE FOR 48 HOURS PRIOR TO DAY OF SURGERY. MEN MAY SHAVE FACE AND NECK. CONTACTS, GLASSES, OR DENTURES MAY NOT BE WORN TO SURGERY.  REMEMBER: NO SMOKING, DRUGS OR ALCOHOL FOR 24 HOURS BEFORE YOUR SURGERY.                                    [facility] IS NOT RESPONSIBLE  FOR ANY BELONGINGS.                                                                    Marland Kitchen           [facility] - Preparing for Surgery Before surgery, you can play an important role.  Because skin is not sterile, your skin needs to be as free of germs as possible.  You can reduce the number of germs on your skin by washing with CHG (chlorahexidine gluconate) soap before surgery.  CHG is an antiseptic cleaner which kills germs and bonds with the skin to continue killing germs even after washing. Please DO NOT use if you have an allergy to CHG or antibacterial soaps.  If your skin becomes reddened/irritated stop using the CHG and inform your nurse when you arrive at Short Stay. Do not shave (including legs and underarms) for at least 48 hours prior to the first CHG shower.  You may shave your face/neck. Please follow these instructions carefully:  1.  Shower with CHG Soap the night before surgery and the  morning of Surgery.  2.  If you choose to wash your hair, wash your hair first as usual with your  normal  shampoo.  3.  After you shampoo, rinse your hair and body thoroughly to remove the  shampoo.                                        4.  Use CHG as you would any other liquid soap.  You can apply chg directly  to the skin and wash , chg soap provided, night before and morning of your surgery.  5.  Apply the CHG Soap to your body ONLY FROM THE NECK DOWN.   Do not use on face/ open                            Wound or open sores. Avoid contact with eyes, ears mouth and genitals (private parts).                       Wash face,  Genitals (private parts) with your normal soap.  6.  Wash thoroughly, paying special attention to the area where your surgery  will be performed.  7.  Thoroughly rinse your body with warm water from the neck down.  8.  DO NOT shower/wash with your normal soap after using and rinsing off  the CHG Soap.             9.  Pat yourself dry with a clean towel.            10.  Wear clean pajamas.            11.  Place clean sheets on your bed the night of your first shower and do not  sleep with pets. Day of Surgery : Do not apply any lotions/deodorants the morning of surgery.  Please wear clean clothes to the hospital/surgery center.  IF YOU HAVE ANY SKIN IRRITATION OR PROBLEMS WITH THE SURGICAL SOAP, PLEASE GET A BAR OF GOLD DIAL SOAP AND SHOWER THE NIGHT BEFORE YOUR SURGERY AND THE MORNING OF YOUR SURGERY. PLEASE LET THE NURSE KNOW MORNING OF YOUR SURGERY IF YOU HAD ANY PROBLEMS WITH THE SURGICAL SOAP.   ________________________________________________________________________                                                        QUESTIONS [facility] PRE OP NURSE PHONE [phone].

## 2022-04-03 NOTE — Progress Notes (Signed)
Spoke w/ via phone for pre-op interview---[facility] needs dos----  urine pregnancy per anesthesia, surgeon orders pending as of 04/03/22             Lab results------04/07/22 lab appt for cbc, type & screen COVID test -----patient states asymptomatic no test needed Arrive at -------0530 on Tuesday, 04/11/22 NPO after MN NO Solid Food.  Clear liquids from MN until---0430 Med rec completed Medications to take morning of surgery -----Zofran if needed Diabetic medication -----n/a Patient instructed no nail polish to be worn day of surgery Patient instructed to bring photo id and insurance card day of surgery Patient aware to have Driver (ride ) / caregiver    for 24 hours after surgery - girlfriend, Karma Lew Patient Special Instructions -----Extended / overnight stay instructions given. Pre-Op special Istructions -----Requested orders from Dr. Quincy Simmonds via [facility] on 03/30/22. Patient verbalized understanding of instructions that were given at this phone interview. Patient denies shortness of breath, chest pain, fever, cough at this phone interview.

## 2022-04-05 DIAGNOSIS — Z0289 Encounter for other administrative examinations: Secondary | ICD-10-CM

## 2022-04-06 NOTE — Progress Notes (Signed)
GYNECOLOGY  VISIT   HPI: 46 y.o.   Single  African American  female   G2P2 with Patient's last menstrual period was 04/09/2022 (approximate).   here for post op. Pt is more concerned about residual pain.  Pathology report showed fibroids of the uterus, normal cervix, normal tubes.  No endometriosis seen at the time of surgery.   She is pleased to have surgery completed.   Takes ibuprofen alternating with Percocet for pain control.  Doing well overall.   Bleeding stopped after 3 - 4 days.  Having mucus that is white.   No itching, burning, or odor.   Bowel function was loose, but is now more solid.   GYNECOLOGIC HISTORY: Patient's last menstrual period was 04/09/2022 (approximate). 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 03/01/2022   Endometriosis 01/30/2022   Uterine leiomyoma 01/30/2022    Past Medical History:  Diagnosis Date   Anemia 2023   taking iron supplements   Anxiety    mild   Endometriosis    Fibroids    GERD (gastroesophageal reflux disease)    Headache    migraine, usually around menstrual cycle   Heart murmur    as a child   High cholesterol    diet modification   History of endometrial biopsy 03/08/2022   benign proliferative endometrium    Past Surgical History:  Procedure Laterality Date   COLPOSCOPY  2020   3- 4 during a study for endometriosis   CYSTOSCOPY N/A 04/11/2022   Procedure: CYSTOSCOPY;  Surgeon: Nunzio Cobbs, MD;  Location: [facility];  Service: Gynecology;  Laterality: N/A;   ENDOMETRIAL BIOPSY  03/08/2022   benign proliferative endometrium   LAPAROSCOPY  1999   OVARIAN CYST SURGERY  1999   TOTAL LAPAROSCOPIC HYSTERECTOMY WITH SALPINGECTOMY Bilateral 04/11/2022   Procedure: TOTAL LAPAROSCOPIC HYSTERECTOMY WITH bilateral SALPINGECTOMY;  Surgeon: Nunzio Cobbs, MD;  Location: [facility];  Service: Gynecology;  Laterality: Bilateral;    Current Outpatient Medications  Medication Sig Dispense Refill   aspirin-acetaminophen-caffeine (EXCEDRIN MIGRAINE) [id number] MG per tablet Take 1 tablet by mouth every 6 (six) hours as needed. Patient used this medication for a severe headache.     ibuprofen (ADVIL) 800 MG tablet Take 1 tablet (800 mg total) by mouth every 8 (eight) hours as needed. 30 tablet 1   Iron Combinations (IRON COMPLEX PO) Take by mouth in the morning and at bedtime.     Multiple Vitamin (MULTIVITAMIN WITH MINERALS) TABS tablet Take 1 tablet by mouth daily. gummies     ondansetron (ZOFRAN) 4 MG tablet Take  1 tablet (4 mg total) by mouth every 8 (eight) hours as needed for nausea or vomiting. 15 tablet 0   oxyCODONE-acetaminophen (PERCOCET/ROXICET) 5-325 MG tablet Take 1 tablet by mouth every 4 (four) hours as needed for severe pain. 20 tablet 0   No current facility-administered medications for this visit.     ALLERGIES: Myfembree [relugolix-estradiol-norethindrone] and Other  Family History  Problem Relation Age of Onset   Osteoporosis Mother    Cancer Maternal Grandmother        ovarian    Social History   Socioeconomic History   Marital status: Single    Spouse name: Not on file   Number of children: Not on file   Years of education: Not on file   Highest education level: Not on file  Occupational History   Not on file  Tobacco Use   Smoking status: Former    Types: Cigarettes    Quit date: 2021    Years since quitting:  2.9   Smokeless tobacco: Never   Tobacco comments:    Quit tobacco-now vape user. Patient that she only smoked a few loose cigarettes per day when she was smoking.  Vaping Use   Vaping Use: Every day   Substances: Nicotine, Flavoring  Substance and Sexual Activity   Alcohol use: Yes    Comment: weekends, 2 - 3 glasses of wine   Drug use: No   Sexual activity: Yes    Partners: Female    Birth control/protection: None  Other Topics Concern   Not on file  Social History Narrative   Not on file   Social Determinants of Health   Financial Resource Strain: Not on file  Food Insecurity: Not on file  Transportation Needs: Not on file  Physical Activity: Not on file  Stress: Not on file  Social Connections: Not on file  Intimate Partner Violence: Not on file    Review of Systems  All other systems reviewed and are negative.   PHYSICAL EXAMINATION:    BP 128/84 (BP Location: Left Arm, Patient Position: Sitting)   Wt 158 lb (71.7 kg)   LMP 04/09/2022 (Approximate)   BMI 27.12 kg/m     General appearance: alert, cooperative and appears stated age   Abdomen: All incisions intact.  Left lower incision slightly indurated and tender.  No erythema or ecchymoses.  Abdomen is soft, non-tender, no masses,  no organomegaly  ASSESSMENT  Status post laparoscopic hysterectomy with bilateral salpingectomy, cystoscopy.  Doing well post op.   PLAN  Surgical findings and pathology report reviewed. Continue decreased activity.  FU for 6 week post op visit.    An After Visit Summary was printed and given to the patient.

## 2022-04-07 ENCOUNTER — Encounter (HOSPITAL_COMMUNITY)
Admission: RE | Admit: 2022-04-07 | Discharge: 2022-04-07 | Disposition: A | Discharge status: Home / Self Care | Payer: BC Managed Care – PPO | Source: Ambulatory Visit | Attending: Obstetrics and Gynecology | Admitting: Obstetrics and Gynecology

## 2022-04-07 DIAGNOSIS — Z01812 Encounter for preprocedural laboratory examination: Secondary | ICD-10-CM | POA: Insufficient documentation

## 2022-04-07 DIAGNOSIS — D259 Leiomyoma of uterus, unspecified: Secondary | ICD-10-CM | POA: Diagnosis not present

## 2022-04-07 DIAGNOSIS — R102 Pelvic and perineal pain: Secondary | ICD-10-CM | POA: Diagnosis not present

## 2022-04-07 DIAGNOSIS — N946 Dysmenorrhea, unspecified: Secondary | ICD-10-CM | POA: Diagnosis not present

## 2022-04-07 LAB — CBC
HCT: 37.5 % (ref 36.0–46.0)
Hemoglobin: 11.1 g/dL — ABNORMAL LOW (ref 12.0–15.0)
MCH: 20.4 pg — ABNORMAL LOW (ref 26.0–34.0)
MCHC: 29.6 g/dL — ABNORMAL LOW (ref 30.0–36.0)
MCV: 68.8 fL — ABNORMAL LOW (ref 80.0–100.0)
Platelets: 249 10*3/uL (ref 150–400)
RBC: 5.45 MIL/uL — ABNORMAL HIGH (ref 3.87–5.11)
RDW: 19 % — ABNORMAL HIGH (ref 11.5–15.5)
WBC: 9 10*3/uL (ref 4.0–10.5)
nRBC: 0 % (ref 0.0–0.2)

## 2022-04-07 LAB — BASIC METABOLIC PANEL
Anion gap: 9 (ref 5–15)
BUN: 12 mg/dL (ref 6–20)
CO2: 25 mmol/L (ref 22–32)
Calcium: 9 mg/dL (ref 8.9–10.3)
Chloride: 107 mmol/L (ref 98–111)
Creatinine, Ser: 0.72 mg/dL (ref 0.44–1.00)
GFR, Estimated: >60 mL/min (ref >60)
Glucose, Bld: 89 mg/dL (ref 70–99)
Potassium: 3.9 mmol/L (ref 3.5–5.1)
Sodium: 141 mmol/L (ref 135–145)

## 2022-04-11 ENCOUNTER — Observation Stay (HOSPITAL_BASED_OUTPATIENT_CLINIC_OR_DEPARTMENT_OTHER)
Admission: RE | Admit: 2022-04-11 | Discharge: 2022-04-11 | Disposition: A | Discharge status: Home / Self Care | Payer: BC Managed Care – PPO | Attending: Obstetrics and Gynecology | Admitting: Obstetrics and Gynecology

## 2022-04-11 ENCOUNTER — Encounter (HOSPITAL_BASED_OUTPATIENT_CLINIC_OR_DEPARTMENT_OTHER)
Admission: RE | Disposition: A | Discharge status: Home / Self Care | Payer: Self-pay | Source: Home / Self Care | Attending: Obstetrics and Gynecology

## 2022-04-11 ENCOUNTER — Observation Stay (HOSPITAL_BASED_OUTPATIENT_CLINIC_OR_DEPARTMENT_OTHER): Payer: BC Managed Care – PPO | Admitting: Anesthesiology

## 2022-04-11 ENCOUNTER — Other Ambulatory Visit: Payer: Self-pay

## 2022-04-11 ENCOUNTER — Encounter (HOSPITAL_BASED_OUTPATIENT_CLINIC_OR_DEPARTMENT_OTHER): Payer: Self-pay | Admitting: Obstetrics and Gynecology

## 2022-04-11 DIAGNOSIS — N920 Excessive and frequent menstruation with regular cycle: Secondary | ICD-10-CM | POA: Diagnosis not present

## 2022-04-11 DIAGNOSIS — D259 Leiomyoma of uterus, unspecified: Principal | ICD-10-CM | POA: Insufficient documentation

## 2022-04-11 DIAGNOSIS — Z01818 Encounter for other preprocedural examination: Secondary | ICD-10-CM

## 2022-04-11 DIAGNOSIS — Z87891 Personal history of nicotine dependence: Secondary | ICD-10-CM | POA: Diagnosis not present

## 2022-04-11 DIAGNOSIS — D219 Benign neoplasm of connective and other soft tissue, unspecified: Secondary | ICD-10-CM | POA: Diagnosis not present

## 2022-04-11 DIAGNOSIS — Z90710 Acquired absence of both cervix and uterus: Secondary | ICD-10-CM | POA: Diagnosis present

## 2022-04-11 DIAGNOSIS — N946 Dysmenorrhea, unspecified: Secondary | ICD-10-CM

## 2022-04-11 DIAGNOSIS — D252 Subserosal leiomyoma of uterus: Secondary | ICD-10-CM | POA: Diagnosis not present

## 2022-04-11 DIAGNOSIS — R102 Pelvic and perineal pain: Secondary | ICD-10-CM | POA: Diagnosis not present

## 2022-04-11 DIAGNOSIS — Z8742 Personal history of other diseases of the female genital tract: Secondary | ICD-10-CM | POA: Diagnosis not present

## 2022-04-11 HISTORY — DX: Gastro-esophageal reflux disease without esophagitis: K21.9

## 2022-04-11 HISTORY — DX: Headache, unspecified: R51.9

## 2022-04-11 HISTORY — PX: TOTAL LAPAROSCOPIC HYSTERECTOMY WITH SALPINGECTOMY: SHX6742

## 2022-04-11 HISTORY — PX: CYSTOSCOPY: SHX5120

## 2022-04-11 LAB — TYPE AND SCREEN
ABO/RH(D): O POS
Antibody Screen: NEGATIVE

## 2022-04-11 LAB — POCT PREGNANCY, URINE: Preg Test, Ur: NEGATIVE

## 2022-04-11 LAB — ABO/RH: ABO/RH(D): O POS

## 2022-04-11 SURGERY — HYSTERECTOMY, TOTAL, LAPAROSCOPIC, WITH SALPINGECTOMY
Anesthesia: General | Site: Urethra

## 2022-04-11 MED ORDER — PROPOFOL 10 MG/ML IV BOLUS
INTRAVENOUS | Status: DC | PRN
  Administered 2022-04-11: 150 mg via INTRAVENOUS

## 2022-04-11 MED ORDER — PROMETHAZINE HCL 25 MG/ML IJ SOLN
6.2500 mg | INTRAMUSCULAR | Status: DC | PRN
  Administered 2022-04-11: 6.25 mg via INTRAVENOUS

## 2022-04-11 MED ORDER — SUGAMMADEX SODIUM 200 MG/2ML IV SOLN
INTRAVENOUS | Status: DC | PRN
  Administered 2022-04-11: 250 mg via INTRAVENOUS
  Administered 2022-04-11: 150 mg via INTRAVENOUS

## 2022-04-11 MED ORDER — PHENYLEPHRINE 80 MCG/ML (10ML) SYRINGE FOR IV PUSH (FOR BLOOD PRESSURE SUPPORT)
PREFILLED_SYRINGE | INTRAVENOUS | Status: DC | PRN
  Administered 2022-04-11: 80 ug via INTRAVENOUS

## 2022-04-11 MED ORDER — FENTANYL CITRATE (PF) 100 MCG/2ML IJ SOLN
25.0000 ug | INTRAMUSCULAR | Status: DC | PRN
  Administered 2022-04-11 (×2): 25 ug via INTRAVENOUS
  Administered 2022-04-11 (×2): 50 ug via INTRAVENOUS

## 2022-04-11 MED ORDER — DEXAMETHASONE SODIUM PHOSPHATE 10 MG/ML IJ SOLN
INTRAMUSCULAR | Status: DC | PRN
  Administered 2022-04-11: 10 mg via INTRAVENOUS

## 2022-04-11 MED ORDER — ENOXAPARIN SODIUM 40 MG/0.4ML IJ SOSY
PREFILLED_SYRINGE | INTRAMUSCULAR | Status: AC
  Filled 2022-04-11: qty 0.4

## 2022-04-11 MED ORDER — AMISULPRIDE (ANTIEMETIC) 5 MG/2ML IV SOLN
10.0000 mg | Freq: Once | INTRAVENOUS | Status: AC | PRN
  Administered 2022-04-11: 10 mg via INTRAVENOUS

## 2022-04-11 MED ORDER — PROPOFOL 10 MG/ML IV BOLUS
INTRAVENOUS | Status: AC
  Filled 2022-04-11: qty 20

## 2022-04-11 MED ORDER — HEMOSTATIC AGENTS (NO CHARGE) OPTIME
TOPICAL | Status: DC | PRN
  Administered 2022-04-11: 1

## 2022-04-11 MED ORDER — DEXMEDETOMIDINE HCL IN NACL 80 MCG/20ML IV SOLN
INTRAVENOUS | Status: AC
  Filled 2022-04-11: qty 20

## 2022-04-11 MED ORDER — SCOPOLAMINE 1 MG/3DAYS TD PT72
MEDICATED_PATCH | TRANSDERMAL | Status: AC
  Filled 2022-04-11: qty 1

## 2022-04-11 MED ORDER — IBUPROFEN 800 MG PO TABS: 800.0000 mg | ORAL_TABLET | Freq: Three times a day (TID) | ORAL | 1 refills | Status: AC | PRN

## 2022-04-11 MED ORDER — PROMETHAZINE HCL 25 MG/ML IJ SOLN
INTRAMUSCULAR | Status: AC
  Filled 2022-04-11: qty 1

## 2022-04-11 MED ORDER — GABAPENTIN 300 MG PO CAPS
300.0000 mg | ORAL_CAPSULE | ORAL | Status: AC
  Administered 2022-04-11: 300 mg via ORAL

## 2022-04-11 MED ORDER — MIDAZOLAM HCL 2 MG/2ML IJ SOLN
INTRAMUSCULAR | Status: AC
  Filled 2022-04-11: qty 2

## 2022-04-11 MED ORDER — FENTANYL CITRATE (PF) 100 MCG/2ML IJ SOLN
INTRAMUSCULAR | Status: DC | PRN
  Administered 2022-04-11: 100 ug via INTRAVENOUS
  Administered 2022-04-11 (×3): 50 ug via INTRAVENOUS

## 2022-04-11 MED ORDER — OXYCODONE HCL 5 MG PO TABS
ORAL_TABLET | ORAL | Status: AC
  Filled 2022-04-11: qty 1

## 2022-04-11 MED ORDER — KETOROLAC TROMETHAMINE 30 MG/ML IJ SOLN
INTRAMUSCULAR | Status: AC
  Filled 2022-04-11: qty 1

## 2022-04-11 MED ORDER — ROCURONIUM BROMIDE 10 MG/ML (PF) SYRINGE
PREFILLED_SYRINGE | INTRAVENOUS | Status: DC | PRN
  Administered 2022-04-11: 60 mg via INTRAVENOUS

## 2022-04-11 MED ORDER — SODIUM CHLORIDE 0.9 % IV SOLN
INTRAVENOUS | Status: AC
  Filled 2022-04-11: qty 2

## 2022-04-11 MED ORDER — ACETAMINOPHEN 10 MG/ML IV SOLN: 1000.0000 mg | Freq: Once | INTRAVENOUS | Status: DC | PRN

## 2022-04-11 MED ORDER — FENTANYL CITRATE (PF) 100 MCG/2ML IJ SOLN
INTRAMUSCULAR | Status: AC
  Filled 2022-04-11: qty 2

## 2022-04-11 MED ORDER — EPHEDRINE SULFATE-NACL 50-0.9 MG/10ML-% IV SOSY
PREFILLED_SYRINGE | INTRAVENOUS | Status: DC | PRN
  Administered 2022-04-11 (×2): 5 mg via INTRAVENOUS

## 2022-04-11 MED ORDER — ROCURONIUM BROMIDE 10 MG/ML (PF) SYRINGE
PREFILLED_SYRINGE | INTRAVENOUS | Status: AC
  Filled 2022-04-11: qty 10

## 2022-04-11 MED ORDER — MIDAZOLAM HCL 2 MG/2ML IJ SOLN
INTRAMUSCULAR | Status: DC | PRN
  Administered 2022-04-11: 2 mg via INTRAVENOUS

## 2022-04-11 MED ORDER — PHENYLEPHRINE 80 MCG/ML (10ML) SYRINGE FOR IV PUSH (FOR BLOOD PRESSURE SUPPORT)
PREFILLED_SYRINGE | INTRAVENOUS | Status: AC
  Filled 2022-04-11: qty 10

## 2022-04-11 MED ORDER — OXYCODONE-ACETAMINOPHEN 5-325 MG PO TABS: 1.0000 | ORAL_TABLET | ORAL | 0 refills | Status: DC | PRN

## 2022-04-11 MED ORDER — FENTANYL CITRATE (PF) 250 MCG/5ML IJ SOLN
INTRAMUSCULAR | Status: AC
  Filled 2022-04-11: qty 5

## 2022-04-11 MED ORDER — LIDOCAINE 2% (20 MG/ML) 5 ML SYRINGE
INTRAMUSCULAR | Status: DC | PRN
  Administered 2022-04-11: 40 mg via INTRAVENOUS

## 2022-04-11 MED ORDER — OXYCODONE HCL 5 MG/5ML PO SOLN: 5.0000 mg | Freq: Once | ORAL | Status: AC | PRN

## 2022-04-11 MED ORDER — KETOROLAC TROMETHAMINE 30 MG/ML IJ SOLN
INTRAMUSCULAR | Status: DC | PRN
  Administered 2022-04-11: 30 mg via INTRAVENOUS

## 2022-04-11 MED ORDER — LACTATED RINGERS IV SOLN: INTRAVENOUS | Status: DC

## 2022-04-11 MED ORDER — SCOPOLAMINE 1 MG/3DAYS TD PT72
1.0000 | MEDICATED_PATCH | TRANSDERMAL | Status: DC
  Administered 2022-04-11: 1 via TRANSDERMAL

## 2022-04-11 MED ORDER — ACETAMINOPHEN 325 MG PO TABS: 325.0000 mg | ORAL_TABLET | ORAL | Status: DC | PRN

## 2022-04-11 MED ORDER — DEXMEDETOMIDINE HCL IN NACL 400 MCG/100ML IV SOLN
INTRAVENOUS | Status: DC | PRN
  Administered 2022-04-11: 4 ug via INTRAVENOUS
  Administered 2022-04-11: 8 ug via INTRAVENOUS

## 2022-04-11 MED ORDER — ACETAMINOPHEN 500 MG PO TABS
1000.0000 mg | ORAL_TABLET | ORAL | Status: AC
  Administered 2022-04-11: 1000 mg via ORAL

## 2022-04-11 MED ORDER — ONDANSETRON HCL 4 MG/2ML IJ SOLN
INTRAMUSCULAR | Status: DC | PRN
  Administered 2022-04-11: 4 mg via INTRAVENOUS

## 2022-04-11 MED ORDER — SODIUM CHLORIDE 0.9 % IR SOLN
Status: DC | PRN
  Administered 2022-04-11: 1000 mL

## 2022-04-11 MED ORDER — SODIUM CHLORIDE 0.9 % IV SOLN
2.0000 g | INTRAVENOUS | Status: AC
  Administered 2022-04-11: 2 g via INTRAVENOUS

## 2022-04-11 MED ORDER — SODIUM CHLORIDE 0.9 % IV SOLN
INTRAVENOUS | Status: DC | PRN
  Administered 2022-04-11: 60 mL

## 2022-04-11 MED ORDER — POVIDONE-IODINE 10 % EX SWAB: 2.0000 | Freq: Once | CUTANEOUS | Status: DC

## 2022-04-11 MED ORDER — EPHEDRINE 5 MG/ML INJ
INTRAVENOUS | Status: AC
  Filled 2022-04-11: qty 5

## 2022-04-11 MED ORDER — OXYCODONE HCL 5 MG PO TABS
5.0000 mg | ORAL_TABLET | Freq: Once | ORAL | Status: AC | PRN
  Administered 2022-04-11: 5 mg via ORAL

## 2022-04-11 MED ORDER — ARTIFICIAL TEARS OPHTHALMIC OINT
TOPICAL_OINTMENT | OPHTHALMIC | Status: AC
  Filled 2022-04-11: qty 3.5

## 2022-04-11 MED ORDER — BUPIVACAINE HCL (PF) 0.25 % IJ SOLN
INTRAMUSCULAR | Status: DC | PRN
  Administered 2022-04-11: 5 mL

## 2022-04-11 MED ORDER — ACETAMINOPHEN 500 MG PO TABS
ORAL_TABLET | ORAL | Status: AC
  Filled 2022-04-11: qty 2

## 2022-04-11 MED ORDER — ENOXAPARIN SODIUM 40 MG/0.4ML IJ SOSY
40.0000 mg | PREFILLED_SYRINGE | INTRAMUSCULAR | Status: AC
  Administered 2022-04-11: 40 mg via SUBCUTANEOUS

## 2022-04-11 MED ORDER — AMISULPRIDE (ANTIEMETIC) 5 MG/2ML IV SOLN
INTRAVENOUS | Status: AC
  Filled 2022-04-11: qty 4

## 2022-04-11 MED ORDER — LIDOCAINE HCL (PF) 2 % IJ SOLN
INTRAMUSCULAR | Status: AC
  Filled 2022-04-11: qty 5

## 2022-04-11 MED ORDER — ACETAMINOPHEN 160 MG/5ML PO SOLN: 325.0000 mg | ORAL | Status: DC | PRN

## 2022-04-11 MED ORDER — 0.9 % SODIUM CHLORIDE (POUR BTL) OPTIME
TOPICAL | Status: DC | PRN
  Administered 2022-04-11: 500 mL

## 2022-04-11 MED ORDER — GABAPENTIN 300 MG PO CAPS
ORAL_CAPSULE | ORAL | Status: AC
  Filled 2022-04-11: qty 1

## 2022-04-11 SURGICAL SUPPLY — 45 items
ADH SKN CLS APL DERMABOND .7 (GAUZE/BANDAGES/DRESSINGS) ×2
APL ESCP 73.6OZ SRGCL (TIP) ×2
COVER BACK TABLE 60X90IN (DRAPES) IMPLANT
COVER MAYO STAND STRL (DRAPES) ×2 IMPLANT
DERMABOND ADVANCED .7 DNX12 (GAUZE/BANDAGES/DRESSINGS) ×2 IMPLANT
DURAPREP 26ML APPLICATOR (WOUND CARE) ×2 IMPLANT
GAUZE 4X4 16PLY ~~LOC~~+RFID DBL (SPONGE) ×4 IMPLANT
GLOVE BIO SURGEON STRL SZ 6.5 (GLOVE) ×4 IMPLANT
GLOVE BIOGEL PI IND STRL 6.5 (GLOVE) IMPLANT
GLOVE BIOGEL PI IND STRL 7.0 (GLOVE) ×2 IMPLANT
GLOVE SURG SS PI 7.0 STRL IVOR (GLOVE) IMPLANT
GOWN STRL REUS W/TWL LRG LVL3 (GOWN DISPOSABLE) ×8 IMPLANT
IV NS 1000ML (IV SOLUTION) ×4
IV NS 1000ML BAXH (IV SOLUTION) IMPLANT
KIT TURNOVER CYSTO (KITS) ×2 IMPLANT
LIGASURE VESSEL 5MM BLUNT TIP (ELECTROSURGICAL) ×2 IMPLANT
NDL INSUFFLATION 14GA 120MM (NEEDLE) ×2 IMPLANT
NEEDLE INSUFFLATION 14GA 120MM (NEEDLE) ×2 IMPLANT
NS IRRIG 500ML POUR BTL (IV SOLUTION) IMPLANT
OCCLUDER COLPOPNEUMO (BALLOONS) ×2 IMPLANT
PACK LAPAROSCOPY BASIN (CUSTOM PROCEDURE TRAY) ×2 IMPLANT
PACK TRENDGUARD 450 HYBRID PRO (MISCELLANEOUS) IMPLANT
POUCH LAPAROSCOPIC INSTRUMENT (MISCELLANEOUS) ×2 IMPLANT
POWDER SURGICEL 3.0 GRAM (HEMOSTASIS) IMPLANT
SET IRRIG Y TYPE TUR BLADDER L (SET/KITS/TRAYS/PACK) ×2 IMPLANT
SET SUCTION IRRIG HYDROSURG (IRRIGATION / IRRIGATOR) ×2 IMPLANT
SET TRI-LUMEN FLTR TB AIRSEAL (TUBING) ×2 IMPLANT
SHEARS HARMONIC ACE PLUS 36CM (ENDOMECHANICALS) IMPLANT
SLEEVE ADV FIXATION 5X100MM (TROCAR) IMPLANT
SPIKE FLUID TRANSFER (MISCELLANEOUS) ×4 IMPLANT
SUT VIC AB 0 CT1 27 (SUTURE) ×4
SUT VIC AB 0 CT1 27XBRD ANBCTR (SUTURE) ×4 IMPLANT
SUT VIC AB 4-0 PS2 18 (SUTURE) ×2 IMPLANT
SUT VLOC 180 0 9IN  GS21 (SUTURE) ×2
SUT VLOC 180 0 9IN GS21 (SUTURE) IMPLANT
SYR 10ML LL (SYRINGE) ×2 IMPLANT
SYR 50ML LL SCALE MARK (SYRINGE) ×4 IMPLANT
TIP ENDOSCOPIC SURGICEL (TIP) IMPLANT
TIP UTERINE 6.7X8CM BLUE DISP (MISCELLANEOUS) IMPLANT
TOWEL OR 17X26 10 PK STRL BLUE (TOWEL DISPOSABLE) ×2 IMPLANT
TRAY FOLEY W/BAG SLVR 14FR LF (SET/KITS/TRAYS/PACK) ×2 IMPLANT
TRENDGUARD 450 HYBRID PRO PACK (MISCELLANEOUS) ×2
TROCAR ADV FIXATION 5X100MM (TROCAR) ×2 IMPLANT
TROCAR KII 8X100ML NONTHREADED (TROCAR) IMPLANT
WARMER LAPAROSCOPE (MISCELLANEOUS) ×2 IMPLANT

## 2022-04-11 NOTE — Anesthesia Preprocedure Evaluation (Addendum)
Anesthesia Evaluation  Patient identified by MRN, date of birth, ID band Patient awake    Reviewed: Allergy & Precautions, NPO status , Patient's Chart, lab work & pertinent test results  Airway Mallampati: I  TM Distance: >3 FB Neck ROM: Full    Dental  (+) Teeth Intact, Dental Advisory Given   Pulmonary former smoker   breath sounds clear to auscultation       Cardiovascular + Valvular Problems/Murmurs  Rhythm:Regular Rate:Normal     Neuro/Psych  Headaches  Anxiety        GI/Hepatic Neg liver ROS,GERD  ,,  Endo/Other  negative endocrine ROS    Renal/GU negative Renal ROS     Musculoskeletal negative musculoskeletal ROS (+)    Abdominal   Peds  Hematology   Anesthesia Other Findings   Reproductive/Obstetrics                             Anesthesia Physical Anesthesia Plan  ASA: 2  Anesthesia Plan: General   Post-op Pain Management:    Induction: Intravenous  PONV Risk Score and Plan: 4 or greater and Ondansetron, Dexamethasone, Midazolam and Scopolamine patch - Pre-op  Airway Management Planned: Oral ETT  Additional Equipment: None  Intra-op Plan:   Post-operative Plan: Extubation in OR  Informed Consent: I have reviewed the patients History and Physical, chart, labs and discussed the procedure including the risks, benefits and alternatives for the proposed anesthesia with the patient or authorized representative who has indicated his/her understanding and acceptance.     Dental advisory given  Plan Discussed with: CRNA  Anesthesia Plan Comments:        Anesthesia Quick Evaluation

## 2022-04-11 NOTE — Op Note (Signed)
OPERATIVE REPORT   PREOPERATIVE DIAGNOSIS:   Fibroids, dysmenorrhea, pelvic pain, history of endometriosis.  POSTOPERATIVE DIAGNOSIS:   Fibroids, dysmenorrhea, pelvic pain, history of endometriosis.  PROCEDURES:  Total laparoscopic hysterectomy with bilateral salpingectomy, cystoscopy   SURGEON:   Lenard Galloway, M.D.   ASSISTANT:   Dorothy Spark, M.D.     ANESTHESIA:  General endotracheal, intraperitoneal ropivicaine 30 mL diluted in 30 mL of normal saline, local with 0.25% Marcaine.   IVF:  900 cc LR   ESTIMATED BLOOD LOSS:   25 cc   URINE OUTPUT:  009 cc   COMPLICATIONS:  None.   INDICATIONS FOR THE PROCEDURE:      The patient is a 46 year old G32P2 African American female with a history of surgically proven endometriosis, who presents with heavy and painful periods and pelvic pain.  By ultrasound, she has several fibroids, and her ovaries are normal.  Her endometrial biopsy showed benign endometrium.  She has requested hysterectomy for treatment and declines medical therapy and future childbearing.     A plan is made to proceed with a total laparoscopic hysterectomy with bilateral salpingectomy, possible bilateral oophorectomy, and cystoscopy after risks, benefits, and alternatives are reviewed.   FINDINGS:      Exam under anesthesia revealed a small, mobile uterus.  No adnexal masses were noted.     Laparoscopy revealed a subserosal fibroid of the anterior uterus, normal tubes, and normal ovaries.  The liver and appendix appeared normal.  There were no adhesions in the abdomen or pelvis.  There was no sign of endometriosis.    Cystoscopy at the termination of the procedure showed the bladder to be normal throughout 360 degrees including the bladder dome and trigone. There was no evidence of any foreign body in the bladder or the urethra. There was no evidence of any lesions of the bladder or the urethra.  Both of the ureters were noted to be patent bilaterally.      SPECIMENS:      The uterus, cervix, and bilateral tubes were went to pathology.    DESCRIPTION OF PROCEDURE:     The patient was reidentified in the preoperative hold area.   She did receive Cefotetan IV for antibiotic prophylaxis.  She received Lovenox, TED hose, and PAS stockings for DVT prophylaxis.   In the operating room, the patient was placed in the dorsal lithotomy position on the operating room table.   Her legs were placed in the [place] stirrups and her arms were both tucked at her sides. The Trendguard was used to support her shoulders. The patient received general endotracheal anesthesia. The abdomen and vagina were then sterilely prepped and she was sterilely draped.   A speculum was placed in the vagina and a single-tooth tenaculum was placed on the anterior cervical lip.  A figure-of-eight suture of 0 Vicryl was placed on each the anterior and the posterior cervical lips.  The uterus was sounded to just over 8 cm cm.  The cervix was then dilated with [facility] dilators.  A #8 RUMI tip was placed with a KOH ring through the cervix and into the uterine cavity without difficulty.  The remaining vaginal instruments were then removed.      A Foley catheter was placed inside the bladder and left to gravity drainage.     Attention was turned to the abdomen where the umbilicus was injected with .25% Marcaine.  A 5 mm incision was created and penetrating towel clips placed in the  incision were used to raise the abdominal wall while a Veress needle was placed in the peritoneal cavity.  Saline flowed freely through the needle with the drop test.  A CO2 pneumoperitoneum was achieved.  A 5 mm Optiview approach was used to place the trocar in this incision.  An 8 mm trocar was placed under laparoscopic guidance in the left mid quadrant after injecting with Marcaine and incising with a scalpel.  An 8 mm incision was created in the right mid abdomen and a 5 mm Airseal trocar was placed under visualization  of the laparoscope.  A 5 mm incision was placed left lower abdomen after the skin was injected locally with 0.25% Marcaine.  A 5 mm trocar was then placed under visualization of the laparoscope.     The patient was placed in Trendelenburg position.  An inspection of the abdomen and pelvis was performed. The findings are as noted above.   Ropivicine 30 cc diluted in 30 cc of normal saline was placed inside the peritoneal cavity.    The left fallopian tube was grasped and the Ligasure was used to cauterize and cut through the mesosalpinx and then the proximal tube.  The left tube was removed from the peritoneal cavity and then later sent to pathology. The left utero-ovarian ligament was cauterized and cut with the Ligasure.  The left round ligament was then cauterized and divided with the Ligasure.  Dissection was performed to the anterior and posterior leaves of the broad ligaments using the Harmonic scalpel.  The bladder was dissected off of the lower uterine segment and cervix using the Harmonic scalpel.  The uterine artery was skeletonized with the Harmonic scalpel, and the Ligasure instrument was used to cauterize and cut the pedicle.    The same procedure that was performed on the left side was then repeated on the right side.  The right fallopian tube was removed from the peritoneal cavity and later sent to pathology with the uterus and the cervix.     The KOH ring was nicely visible.  The colpotomy incision was performed with the Harmonic scalpel in a circumferential fashion. The uterus with cervix and the Rumi device were then removed vaginally, and the uterus and cervix were sent to Pathology with the bilateral fallopian tubes. The balloon occluder was placed in the vagina.  The vaginal cuff was sutured laparoscopically using a running suture of 0 V-Loc.  The vagina was closed from the patient's right hand side to the left hand side and then back 2 sutures towards the midline. This provided good  full-thickness closure of the vaginal cuff.  The laparoscopic needle for suturing was removed from the peritoneal cavity.   The pelvis was irrigated and suctioned.  The pneumoperitoneam was decreased down to a pressure of 8.  There was good hemostasis of the operative sites and pedicles.    Surgicell powder was placed over the operative sites and then rinsed away after 1 minute.  Hemostasis was good.     The CO2 pneumoperitoneum was released.  The patient received manual breaths to remove any remaining CO2 gas and all trocars were removed.  The umbilicus was closed with a subcutaneous suture of 4/0 Vicryl.  All trocar skin incisions were closed with subcuticular sutures of 4/0 Vicryl.  Dermabond was placed over all of the incisions.   The vaginal occluder balloon was removed from the vagina.  The vaginal cuff was noted to be closed and no vaginal lesions were noted.  The patient's Foley catheter was removed and cystoscopy was performed and the findings are as noted above. The Foley catheter was left out.    This concluded the patient's procedure.  There were no complications to the procedure.  All needle, instrument, and sponge counts were correct.   An MD assistant was necessary for the procedure for positioning, instrumentation, and visualization of surgical field due to the complexity of the case.   Lenard Galloway, M.D.

## 2022-04-11 NOTE — Transfer of Care (Signed)
Immediate Anesthesia Transfer of Care Note  Patient: Jillian Vega  Procedure(s) Performed: Procedure(s) (LRB): TOTAL LAPAROSCOPIC HYSTERECTOMY WITH bilateral SALPINGECTOMY (Bilateral) CYSTOSCOPY (N/A)  Patient Location: PACU  Anesthesia Type: General  Level of Consciousness: drowsy  Airway & Oxygen Therapy: Patient Spontanous Breathing and Patient connected to nasal cannula oxygen  Post-op Assessment: Report given to PACU RN and Post -op Vital signs reviewed and stable  Post vital signs: Reviewed and stable  Complications: No apparent anesthesia complications  Last Vitals:  Vitals Value Taken Time  BP 130/82 04/11/22 0947  Temp    Pulse 80 04/11/22 0952  Resp 14 04/11/22 0952  SpO2 100 % 04/11/22 0952  Vitals shown include unvalidated device data.  Last Pain:  Vitals:   04/11/22 0620  TempSrc: Oral  PainSc: 0-No pain      Patients Stated Pain Goal: 3 (56/38/75 6433)  Complications: No notable events documented.

## 2022-04-11 NOTE — Anesthesia Procedure Notes (Signed)
Procedure Name: Intubation Date/Time: 04/11/2022 7:40 AM  Performed by: Mechele Claude, CRNAPre-anesthesia Checklist: Patient identified, Emergency Drugs available, Suction available and Patient being monitored Patient Re-evaluated:Patient Re-evaluated prior to induction Oxygen Delivery Method: Circle system utilized Preoxygenation: Pre-oxygenation with 100% oxygen Induction Type: IV induction Ventilation: Mask ventilation without difficulty Laryngoscope Size: Mac and 3 Grade View: Grade I Tube type: Oral Tube size: 7.0 mm Number of attempts: 1 Airway Equipment and Method: Stylet and Oral airway Placement Confirmation: ETT inserted through vocal cords under direct vision, positive ETCO2 and breath sounds checked- equal and bilateral Secured at: 21 cm Tube secured with: Tape Dental Injury: Teeth and Oropharynx as per pre-operative assessment

## 2022-04-11 NOTE — Anesthesia Postprocedure Evaluation (Signed)
Anesthesia Post Note  Patient: [name]  Procedure(s) Performed: TOTAL LAPAROSCOPIC HYSTERECTOMY WITH bilateral SALPINGECTOMY (Bilateral: Abdomen) CYSTOSCOPY (Urethra)     Patient location during evaluation: PACU Anesthesia Type: General Level of consciousness: awake and alert Pain management: pain level controlled Vital Signs Assessment: post-procedure vital signs reviewed and stable Respiratory status: spontaneous breathing, nonlabored ventilation, respiratory function stable and patient connected to nasal cannula oxygen Cardiovascular status: blood pressure returned to baseline and stable Postop Assessment: no apparent nausea or vomiting Anesthetic complications: no   No notable events documented.  Last Vitals:  Vitals:   04/11/22 1230 04/11/22 1330  BP: 133/84 123/88  Pulse: 73 82  Resp: 12 14  Temp: 36.8 C 36.6 C  SpO2: 93% 100%    Last Pain:  Vitals:   04/11/22 1330  TempSrc:   PainSc: [place]

## 2022-04-11 NOTE — Progress Notes (Signed)
Update to History and Physical  No marked change in status since office pre-op visit.   Patient examined.   OK to proceed with surgery. 

## 2022-04-11 NOTE — Discharge Instructions (Addendum)
Hi Jillian Vega,   Your surgery went well.  I removed your uterus, cervix, and fallopian tubes.  You still have your ovaries.   I did not find any endometriosis today!  [name], MD         No acetaminophen/Tylenol until after 12:00 pm today if needed.  No ibuprofen, Advil, Aleve, Motrin, ketorolac, meloxicam, naproxen, or other NSAIDS until after 5:30 pm today if needed.     Post Anesthesia Home Care Instructions  Activity: Get plenty of rest for the remainder of the day. A responsible individual must stay with you for 24 hours following the procedure.  For the next 24 hours, DO NOT: -Drive a car -Paediatric nurse -Drink alcoholic beverages -Take any medication unless instructed by your physician -Make any legal decisions or sign important papers.  Meals: Start with liquid foods such as gelatin or soup. Progress to regular foods as tolerated. Avoid greasy, spicy, heavy foods. If nausea and/or vomiting occur, drink only clear liquids until the nausea and/or vomiting subsides. Call your physician if vomiting continues.  Special Instructions/Symptoms: Your throat may feel dry or sore from the anesthesia or the breathing tube placed in your throat during surgery. If this causes discomfort, gargle with warm salt water. The discomfort should disappear within 24 hours.  If you had a scopolamine patch placed behind your ear for the management of post- operative nausea and/or vomiting:  1. The medication in the patch is effective for 72 hours, after which it should be removed.  Wrap patch in a tissue and discard in the trash. Wash hands thoroughly with soap and water. 2. You may remove the patch earlier than 72 hours if you experience unpleasant side effects which may include dry mouth, dizziness or visual disturbances. 3. Avoid touching the patch. Wash your hands with soap and water after contact with the patch.

## 2022-04-12 LAB — SURGICAL PATHOLOGY

## 2022-04-13 ENCOUNTER — Encounter (HOSPITAL_BASED_OUTPATIENT_CLINIC_OR_DEPARTMENT_OTHER): Payer: Self-pay | Admitting: Obstetrics and Gynecology

## 2022-04-19 ENCOUNTER — Ambulatory Visit (INDEPENDENT_AMBULATORY_CARE_PROVIDER_SITE_OTHER): Payer: BC Managed Care – PPO | Admitting: Obstetrics and Gynecology

## 2022-04-19 ENCOUNTER — Encounter: Payer: Self-pay | Admitting: Obstetrics and Gynecology

## 2022-04-19 VITALS — BP 128/84 | Wt 158.0 lb

## 2022-04-19 DIAGNOSIS — Z90710 Acquired absence of both cervix and uterus: Secondary | ICD-10-CM

## 2022-04-21 NOTE — Discharge Summary (Signed)
Physician Discharge Summary  Patient ID: Jillian Vega MRN: [id number] DOB/AGE: 44-30-1977 44 y.o.  Admit date: 04/11/2022 Discharge date:  04/11/22  Admission Diagnoses: Fibroids Dysmenorrhea Pelvic pain History of endometriosis  Discharge Diagnoses:  Fibroids Dysmenorrhea Pelvic pain History of endometriosis Status post total laparoscopic hysterectomy with bilateral salpingectomy, cystoscopy  Principal Problem:   Status post laparoscopic hysterectomy   Discharged Condition: good  Hospital Course:  The patient was admitted on 04/11/22 for a total laparoscopic hysterectomy with bilateral salpingectomy, cystoscopy which were performed without complication while under general anesthesia.  The patient's post op course was uneventful.  She remained int he PACU for a brief stay following surgery, and was discharged to home with stable vital signs, good pain control, ability to void spontaneously, and ability to keep oral intake down.   Consults: None  Significant Diagnostic Studies:  None  Treatments: surgery: total laparoscopic hysterectomy with bilateral salpingectomy, cystoscopy  Discharge Exam: Blood pressure 123/88, pulse 82, temperature 97.8 F (36.6 C), resp. rate 14, height '5\' 4"'$  (1.626 m), weight 68 kg, last menstrual period 04/09/2022, SpO2 100 %.  Disposition:  The patient was discharged to home in good condition.   Post op instructions were reviewed in verbal and written form.    Allergies as of 04/11/2022       Reactions   Myfembree [relugolix-estradiol-norethindrone] Other (See Comments)   Back and side pain per patient.   Other Dermatitis   Dye in clothes (not medical)        Medication List     TAKE these medications    ibuprofen 800 MG tablet Commonly known as: ADVIL Take 1 tablet (800 mg total) by mouth every 8 (eight) hours as needed. What changed:  medication strength how much to take when to take this reasons to take this   IRON  COMPLEX PO Take by mouth in the morning and at bedtime.   multivitamin with minerals Tabs tablet Take 1 tablet by mouth daily. gummies   ondansetron 4 MG tablet Commonly known as: ZOFRAN Take 1 tablet (4 mg total) by mouth every 8 (eight) hours as needed for nausea or vomiting.   oxyCODONE-acetaminophen 5-325 MG tablet Commonly known as: PERCOCET/ROXICET Take 1 tablet by mouth every 4 (four) hours as needed for severe pain.       ASK your doctor about these medications    aspirin-acetaminophen-caffeine 250-250-65 MG tablet Commonly known as: EXCEDRIN MIGRAINE Take 1 tablet by mouth every 6 (six) hours as needed. Patient used this medication for a severe headache. Notes to patient: If taking this medication, please make sure to include the 250 mg of Acetaminophen in the total amount that you take in 24 hrs. Do not exceed the maximum recommended dose of Acetaminophen (4,000 mg in 24 hrs).        Follow-up Information     Nunzio Cobbs, MD Follow up in 1 week(s).   Specialty: Obstetrics and Gynecology Why: post op visit Contact information: [address] [place] [address] [phone]                 Signed: Arloa Koh 04/21/2022, 7:44 AM

## 2022-04-27 ENCOUNTER — Emergency Department (HOSPITAL_BASED_OUTPATIENT_CLINIC_OR_DEPARTMENT_OTHER)
Admission: EM | Admit: 2022-04-27 | Discharge: 2022-04-27 | Disposition: A | Discharge status: Home / Self Care | Payer: BC Managed Care – PPO | Attending: Emergency Medicine | Admitting: Emergency Medicine

## 2022-04-27 ENCOUNTER — Other Ambulatory Visit: Payer: Self-pay

## 2022-04-27 ENCOUNTER — Encounter (HOSPITAL_BASED_OUTPATIENT_CLINIC_OR_DEPARTMENT_OTHER): Payer: Self-pay

## 2022-04-27 DIAGNOSIS — Z7982 Long term (current) use of aspirin: Secondary | ICD-10-CM | POA: Diagnosis not present

## 2022-04-27 DIAGNOSIS — Z1152 Encounter for screening for COVID-19: Secondary | ICD-10-CM | POA: Insufficient documentation

## 2022-04-27 DIAGNOSIS — J111 Influenza due to unidentified influenza virus with other respiratory manifestations: Secondary | ICD-10-CM | POA: Diagnosis not present

## 2022-04-27 DIAGNOSIS — J101 Influenza due to other identified influenza virus with other respiratory manifestations: Secondary | ICD-10-CM | POA: Diagnosis not present

## 2022-04-27 DIAGNOSIS — R0981 Nasal congestion: Secondary | ICD-10-CM | POA: Diagnosis not present

## 2022-04-27 LAB — RESP PANEL BY RT-PCR (RSV, FLU A&B, COVID)  RVPGX2
Influenza A by PCR: POSITIVE — AB
Influenza B by PCR: NEGATIVE
Resp Syncytial Virus by PCR: NEGATIVE
SARS Coronavirus 2 by RT PCR: NEGATIVE

## 2022-04-27 MED ORDER — KETOROLAC TROMETHAMINE 30 MG/ML IJ SOLN
15.0000 mg | Freq: Once | INTRAMUSCULAR | Status: AC
  Administered 2022-04-27: 15 mg via INTRAVENOUS
  Filled 2022-04-27: qty 1

## 2022-04-27 MED ORDER — SODIUM CHLORIDE 0.9 % IV BOLUS
1000.0000 mL | Freq: Once | INTRAVENOUS | Status: AC
  Administered 2022-04-27: 1000 mL via INTRAVENOUS

## 2022-04-27 NOTE — ED Notes (Signed)
Last tylenol 1500, last ibuprofen 1000.

## 2022-04-27 NOTE — ED Triage Notes (Signed)
Pt c/o flu-like symptoms. In [place] 12/23-12/26.

## 2022-04-27 NOTE — ED Provider Notes (Signed)
[place] [facility] DEPARTMENT Provider Note   CSN: [id number] Arrival date & time: 04/27/22  1528     History  Chief Complaint  Patient presents with   Fever   Cough   Diarrhea    Jillian Vega is a 46 y.o. female.  Patient is a 46 year old female who presents with flulike symptoms.  It started on December 26.  She has runny nose congestion and achiness.  She also has a scratchy throat and a headache.  She has had some diarrhea and nausea but no vomiting.  She feels very fatigued.  She is also had some fevers up to 102.       Home Medications Prior to Admission medications   Medication Sig Start Date End Date Taking? Authorizing Provider  aspirin-acetaminophen-caffeine (EXCEDRIN MIGRAINE) [phone] MG per tablet Take 1 tablet by mouth every 6 (six) hours as needed. Patient used this medication for a severe headache.    [provider]  ibuprofen (ADVIL) 800 MG tablet Take 1 tablet (800 mg total) by mouth every 8 (eight) hours as needed. 04/11/22   Nunzio Cobbs, MD  Iron Combinations (IRON COMPLEX PO) Take by mouth in the morning and at bedtime.    [provider]  Multiple Vitamin (MULTIVITAMIN WITH MINERALS) TABS tablet Take 1 tablet by mouth daily. gummies    [provider]  ondansetron (ZOFRAN) 4 MG tablet Take 1 tablet (4 mg total) by mouth every 8 (eight) hours as needed for nausea or vomiting. 02/23/22   Tegeler, Gwenyth Allegra, MD  oxyCODONE-acetaminophen (PERCOCET/ROXICET) 5-325 MG tablet Take 1 tablet by mouth every 4 (four) hours as needed for severe pain. 04/11/22   Nunzio Cobbs, MD      Allergies    Myfembree [relugolix-estradiol-norethindrone] and Other    Review of Systems   Review of Systems  Constitutional:  Positive for chills, fatigue and fever. Negative for diaphoresis.  HENT:  Positive for congestion, rhinorrhea and sore throat. Negative for sneezing.   Eyes: Negative.   Respiratory:   Positive for cough. Negative for chest tightness and shortness of breath.   Cardiovascular:  Negative for chest pain and leg swelling.  Gastrointestinal:  Positive for diarrhea and nausea. Negative for abdominal pain, blood in stool and vomiting.  Genitourinary:  Negative for difficulty urinating, flank pain, frequency and hematuria.  Musculoskeletal:  Positive for myalgias. Negative for arthralgias and back pain.  Skin:  Negative for rash.  Neurological:  Positive for headaches. Negative for dizziness, speech difficulty, weakness and numbness.    Physical Exam Updated Vital Signs BP 122/75 (BP Location: Left Arm)   Pulse 93   Temp 99.5 F (37.5 C) (Oral)   Resp 18   LMP 04/09/2022 (Approximate)   SpO2 98%  Physical Exam Constitutional:      Appearance: She is well-developed.  HENT:     Head: Normocephalic and atraumatic.     Mouth/Throat:     Pharynx: No oropharyngeal exudate or posterior oropharyngeal erythema.  Eyes:     Pupils: Pupils are equal, round, and reactive to light.  Cardiovascular:     Rate and Rhythm: Normal rate and regular rhythm.     Heart sounds: Normal heart sounds.  Pulmonary:     Effort: Pulmonary effort is normal. No respiratory distress.     Breath sounds: Normal breath sounds. No wheezing or rales.  Chest:     Chest wall: No tenderness.  Abdominal:     General: Bowel  sounds are normal.     Palpations: Abdomen is soft.     Tenderness: There is no abdominal tenderness. There is no guarding or rebound.  Musculoskeletal:        General: Normal range of motion.     Cervical back: Normal range of motion and neck supple.  Lymphadenopathy:     Cervical: No cervical adenopathy.  Skin:    General: Skin is warm and dry.     Findings: No rash.  Neurological:     Mental Status: She is alert and oriented to person, place, and time.     ED Results / Procedures / Treatments   Labs (all labs ordered are listed, but only abnormal results are  displayed) Labs Reviewed  RESP PANEL BY RT-PCR (RSV, FLU A&B, COVID)  RVPGX2 - Abnormal; Notable for the following components:      Result Value   Influenza A by PCR POSITIVE (*)    All other components within normal limits    EKG None  Radiology No results found.  Procedures Procedures    Medications Ordered in ED Medications  sodium chloride 0.9 % bolus 1,000 mL (0 mLs Intravenous Stopped 04/27/22 2120)  ketorolac (TORADOL) 30 MG/ML injection 15 mg (15 mg Intravenous Given 04/27/22 2037)    ED Course/ Medical Decision Making/ A&P                           Medical Decision Making Risk Prescription drug management.   Patient is a 46 year old female who presents with flulike symptoms.  She is febrile on arrival.  Her influenza test is positive.  She was given IV fluids and a dose of Toradol.  She is feeling much better.  She does not have any suggestions of meningitis.  No clinical signs of pneumonia.  Her vital signs have improved after the fluids.  She overall feels much better.  She was discharged home in good condition.  Return precautions were given.  Final Clinical Impression(s) / ED Diagnoses Final diagnoses:  Influenza    Rx / DC Orders ED Discharge Orders     None         Malvin Johns, MD 04/27/22 2150

## 2022-05-08 NOTE — Progress Notes (Signed)
GYNECOLOGY  VISIT   HPI: 47 y.o.   Single  African American  female   G2P2 with Patient's last menstrual period was 04/09/2022 (approximate).   here for   6 week post op.  Doing really well.  Had one episode of pelvic cramping and took Tylenol.   Using coconut oil and butter for her skin incisions.   No vaginal bleeding.   She is scheduled to go back to work tomorrow.  Needs a note for return to work certification.   GYNECOLOGIC HISTORY: Patient's last menstrual period was 04/09/2022 (approximate). 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   Endometriosis 01/30/2022   Uterine leiomyoma 01/30/2022    Past Medical History:  Diagnosis Date   Anemia 2023   taking iron supplements   Anxiety    mild   Endometriosis    Fibroids    GERD (gastroesophageal reflux disease)    Headache    migraine, usually around menstrual cycle   Heart murmur    as a child   High cholesterol    diet modification   History of endometrial biopsy 03/08/2022   benign proliferative endometrium    Past Surgical History:  Procedure Laterality Date   ABDOMINAL HYSTERECTOMY     COLPOSCOPY  2020   3- 4 during a study for endometriosis   CYSTOSCOPY N/A 04/11/2022   Procedure: CYSTOSCOPY;  Surgeon: Nunzio Cobbs, MD;  Location: [facility];  Service: Gynecology;  Laterality: N/A;   ENDOMETRIAL BIOPSY  03/08/2022   benign proliferative endometrium    LAPAROSCOPY  1999   OVARIAN CYST SURGERY  1999   TOTAL LAPAROSCOPIC HYSTERECTOMY WITH SALPINGECTOMY Bilateral 04/11/2022   Procedure: TOTAL LAPAROSCOPIC HYSTERECTOMY WITH bilateral SALPINGECTOMY;  Surgeon: Nunzio Cobbs, MD;  Location: [facility];  Service: Gynecology;  Laterality: Bilateral;    Current Outpatient Medications  Medication Sig Dispense Refill   aspirin-acetaminophen-caffeine (EXCEDRIN MIGRAINE) [phone] MG per tablet Take 1 tablet by mouth every 6 (six) hours as needed. Patient used this medication for a severe headache.     ibuprofen (ADVIL) 800 MG tablet Take 1 tablet (800 mg total) by mouth every 8 (eight) hours as needed. 30 tablet 1   Iron Combinations (IRON COMPLEX PO) Take by mouth in the morning and at bedtime.     Multiple Vitamin (MULTIVITAMIN WITH MINERALS) TABS tablet Take 1 tablet by mouth daily. gummies     ondansetron (ZOFRAN) 4 MG tablet Take 1 tablet (4 mg total) by mouth every 8 (eight) hours as needed for nausea or vomiting. 15 tablet 0   oxyCODONE-acetaminophen (PERCOCET/ROXICET)  5-325 MG tablet Take 1 tablet by mouth every 4 (four) hours as needed for severe pain. 20 tablet 0   No current facility-administered medications for this visit.     ALLERGIES: Myfembree [relugolix-estradiol-norethindrone] and Other  Family History  Problem Relation Age of Onset   Osteoporosis Mother    Cancer Maternal Grandmother        ovarian    Social History   Socioeconomic History   Marital status: Single    Spouse name: Not on file   Number of children: Not on file   Years of education: Not on file   Highest education level: Not on file  Occupational History   Not on file  Tobacco Use   Smoking status: Former    Types: Cigarettes    Quit date: 2021    Years since quitting: 3.0   Smokeless tobacco: Never   Tobacco comments:    Quit tobacco-now vape user. Patient that she only smoked a few loose cigarettes per day when she was  smoking.  Vaping Use   Vaping Use: Every day   Substances: Nicotine, Flavoring  Substance and Sexual Activity   Alcohol use: Yes    Comment: weekends, 2 - 3 glasses of wine   Drug use: No   Sexual activity: Yes    Partners: Female    Birth control/protection: Surgical    Comment: hysterectomy  Other Topics Concern   Not on file  Social History Narrative   Not on file   Social Determinants of Health   Financial Resource Strain: Not on file  Food Insecurity: Not on file  Transportation Needs: Not on file  Physical Activity: Not on file  Stress: Not on file  Social Connections: Not on file  Intimate Partner Violence: Not on file    Review of Systems  All other systems reviewed and are negative.   PHYSICAL EXAMINATION:    BP 122/76 (BP Location: Left Arm, Patient Position: Sitting, Cuff Size: Normal)   Pulse 84   Ht '5\' 4"'$  (1.626 m)   Wt 149 lb (67.6 kg)   LMP 04/09/2022 (Approximate)   SpO2 98%   BMI 25.58 kg/m     General appearance: alert, cooperative and appears stated age  Abdomen: incisions are intact.  Abdomen is soft, non-tender, no masses,  no organomegaly   Pelvic: External genitalia:  no lesions              Urethra:  normal appearing urethra with no masses, tenderness or lesions              Bartholins and Skenes: normal                 Vagina: normal appearing vagina with normal color and discharge, no lesions. Vaginal cuff intact.               Cervix: absent                Bimanual Exam:  Uterus:  absent              Adnexa: no mass, fullness, tenderness          Chaperone was present for exam:  Raquel Sarna.   ASSESSMENT  Status post laparoscopic hysterectomy with bilateral salpingectomy, cystoscopy.  Doing well.   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  Letter written.  No strenuous lift/exercise/pelvic activity for another 6 weeks.  Fu in 6 more weeks for final post op visit.    An  After Visit Summary was printed and given to the  patient.

## 2022-05-11 ENCOUNTER — Telehealth: Payer: Self-pay | Admitting: *Deleted

## 2022-05-11 NOTE — Telephone Encounter (Signed)
Patient left message stating employer has still not received updated return to work forms for 1/23//24 as previously requested. Patient is requesting forms to be sent to fax [phone].   [employer] -please fax forms, confirm forms received and notify patient. Please provide update once completed.

## 2022-05-11 NOTE — Telephone Encounter (Signed)
Spoke with patient regarding forms. Paper work has been faxed several times with failure. Patient emailed the forms to employer. Patient was in office as I emailed the forms and saw the fax  failure notice .

## 2022-05-22 ENCOUNTER — Encounter: Payer: Self-pay | Admitting: Obstetrics and Gynecology

## 2022-05-22 ENCOUNTER — Ambulatory Visit (INDEPENDENT_AMBULATORY_CARE_PROVIDER_SITE_OTHER): Payer: BC Managed Care – PPO | Admitting: Obstetrics and Gynecology

## 2022-05-22 VITALS — BP 122/76 | HR 84 | Ht 64.0 in | Wt 149.0 lb

## 2022-05-22 DIAGNOSIS — Z90710 Acquired absence of both cervix and uterus: Secondary | ICD-10-CM

## 2022-06-19 NOTE — Progress Notes (Deleted)
GYNECOLOGY  VISIT   HPI: 47 y.o.   Single  African American  female   G2P2 with Patient's last menstrual period was 04/09/2022 (approximate).   here for   12 week post op  GYNECOLOGIC HISTORY: Patient's last menstrual period was 04/09/2022 (approximate). 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 03/01/2022   Endometriosis 01/30/2022   Uterine leiomyoma 01/30/2022    Past Medical History:  Diagnosis Date   Anemia 2023   taking iron supplements   Anxiety    mild   Endometriosis    Fibroids    GERD (gastroesophageal reflux disease)    Headache    migraine, usually around menstrual cycle   Heart murmur    as a child   High cholesterol    diet modification   History of endometrial biopsy 03/08/2022   benign proliferative endometrium    Past Surgical History:  Procedure Laterality Date   ABDOMINAL HYSTERECTOMY     COLPOSCOPY  2020   3- 4 during a study for endometriosis   CYSTOSCOPY N/A 04/11/2022   Procedure: CYSTOSCOPY;  Surgeon: Nunzio Cobbs, MD;  Location: [facility];  Service: Gynecology;  Laterality: N/A;   ENDOMETRIAL BIOPSY  03/08/2022   benign proliferative endometrium   LAPAROSCOPY  1999   OVARIAN CYST SURGERY  1999   TOTAL LAPAROSCOPIC HYSTERECTOMY WITH SALPINGECTOMY Bilateral 04/11/2022   Procedure: TOTAL LAPAROSCOPIC HYSTERECTOMY WITH bilateral SALPINGECTOMY;  Surgeon: Nunzio Cobbs, MD;  Location: [facility];  Service: Gynecology;  Laterality: Bilateral;    Current Outpatient Medications  Medication Sig Dispense Refill   aspirin-acetaminophen-caffeine (EXCEDRIN MIGRAINE) [id number] MG per tablet Take 1 tablet by mouth every 6 (six) hours as needed. Patient used this medication for a severe headache.     ibuprofen (ADVIL) 800 MG tablet Take 1 tablet (800 mg total) by mouth every 8 (eight) hours as needed. 30 tablet 1   Iron Combinations (IRON COMPLEX PO) Take by mouth in the morning and at bedtime.     Multiple Vitamin (MULTIVITAMIN WITH MINERALS) TABS tablet Take 1 tablet by mouth daily. gummies     ondansetron (ZOFRAN) 4 MG tablet Take 1 tablet (4 mg total) by mouth every 8 (eight) hours as needed for nausea or vomiting. 15 tablet 0   oxyCODONE-acetaminophen (PERCOCET/ROXICET) 5-325 MG tablet Take 1 tablet by mouth every 4 (four) hours as needed for severe pain. 20 tablet 0   No current facility-administered medications for this visit.     ALLERGIES: Myfembree [relugolix-estradiol-norethindrone] and Other  Family History  Problem Relation Age of Onset   Osteoporosis  Mother    Cancer Maternal Grandmother        ovarian    Social History   Socioeconomic History   Marital status: Single    Spouse name: Not on file   Number of children: Not on file   Years of education: Not on file   Highest education level: Not on file  Occupational History   Not on file  Tobacco Use   Smoking status: Former    Types: Cigarettes    Quit date: 2021    Years since quitting: 3.1   Smokeless tobacco: Never   Tobacco comments:    Quit tobacco-now vape user. Patient that she only smoked a few loose cigarettes per day when she was smoking.  Vaping Use   Vaping Use: Every day   Substances: Nicotine, Flavoring  Substance and Sexual Activity   Alcohol use: Yes    Comment: weekends, 2 - 3 glasses of wine   Drug use: No   Sexual activity: Yes    Partners: Female    Birth  control/protection: Surgical    Comment: hysterectomy  Other Topics Concern   Not on file  Social History Narrative   Not on file   Social Determinants of Health   Financial Resource Strain: Not on file  Food Insecurity: Not on file  Transportation Needs: Not on file  Physical Activity: Not on file  Stress: Not on file  Social Connections: Not on file  Intimate Partner Violence: Not on file    Review of Systems  PHYSICAL EXAMINATION:    LMP 04/09/2022 (Approximate)     General appearance: alert, cooperative and appears stated age Head: Normocephalic, without obvious abnormality, atraumatic Neck: no adenopathy, supple, symmetrical, trachea midline and thyroid normal to inspection and palpation Lungs: clear to auscultation bilaterally Breasts: normal appearance, no masses or tenderness, No nipple retraction or dimpling, No nipple discharge or bleeding, No axillary or supraclavicular adenopathy Heart: regular rate and rhythm Abdomen: soft, non-tender, no masses,  no organomegaly Extremities: extremities normal, atraumatic, no cyanosis or edema Skin: Skin color, texture, turgor normal. No rashes or lesions Lymph nodes: Cervical, supraclavicular, and axillary nodes normal. No abnormal inguinal nodes palpated Neurologic: Grossly normal  Pelvic: External genitalia:  no lesions              Urethra:  normal appearing urethra with no masses, tenderness or lesions              Bartholins and Skenes: normal                 Vagina: normal appearing vagina with normal color and discharge, no lesions              Cervix: no lesions                Bimanual Exam:  Uterus:  normal size, contour, position, consistency, mobility, non-tender              Adnexa: no mass, fullness, tenderness              Rectal exam: {yes no:[id number]}.  Confirms.              Anus:  normal sphincter tone, no lesions  Chaperone was present for exam:  ***  ASSESSMENT     PLAN     An After Visit  Summary was printed and given to the patient.  ______ minutes face to face time of which over 50% was spent in counseling.

## 2022-07-03 ENCOUNTER — Ambulatory Visit: Payer: BC Managed Care – PPO | Admitting: Obstetrics and Gynecology

## 2022-08-11 ENCOUNTER — Encounter (HOSPITAL_COMMUNITY): Payer: Self-pay

## 2022-08-11 ENCOUNTER — Emergency Department (HOSPITAL_COMMUNITY)
Admission: EM | Admit: 2022-08-11 | Discharge: 2022-08-11 | Disposition: A | Discharge status: Home / Self Care | Payer: No Typology Code available for payment source | Attending: Emergency Medicine | Admitting: Emergency Medicine

## 2022-08-11 DIAGNOSIS — Z7982 Long term (current) use of aspirin: Secondary | ICD-10-CM | POA: Insufficient documentation

## 2022-08-11 DIAGNOSIS — N611 Abscess of the breast and nipple: Secondary | ICD-10-CM | POA: Insufficient documentation

## 2022-08-11 MED ORDER — DOXYCYCLINE HYCLATE 100 MG PO TABS
100.0000 mg | ORAL_TABLET | Freq: Once | ORAL | Status: AC
  Administered 2022-08-11: 100 mg via ORAL
  Filled 2022-08-11: qty 1

## 2022-08-11 MED ORDER — DOXYCYCLINE HYCLATE 100 MG PO CAPS: 100.0000 mg | ORAL_CAPSULE | Freq: Two times a day (BID) | ORAL | 0 refills | Status: DC

## 2022-08-11 NOTE — ED Provider Notes (Signed)
[place] EMERGENCY DEPARTMENT AT [facility] Provider Note   CSN: [id number] Arrival date & time: 08/11/22  0113     History  Chief Complaint  Patient presents with   Abscess    Jillian Vega is a 47 y.o. female.  HPI     This is a 47 year old female who presents with a lesion to the left breast.  Patient reports she has noted a "blackhead" in place on her medial left breast for approximately 1 year.  She is never had an ultrasound or mammogram.  She states that approximately 1 week ago she began develop a whitehead in the similar area and that this "burst."  She reports drainage that has been brown and yellow.  She states that it is sore.  She has not had any fevers or nipple discharge.  Home Medications Prior to Admission medications   Medication Sig Start Date End Date Taking? Authorizing Provider  doxycycline (VIBRAMYCIN) 100 MG capsule Take 1 capsule (100 mg total) by mouth 2 (two) times daily. 08/11/22  Yes Emmamae Mcnamara, Mayer Masker, MD  aspirin-acetaminophen-caffeine (EXCEDRIN MIGRAINE) [phone] MG per tablet Take 1 tablet by mouth every 6 (six) hours as needed. Patient used this medication for a severe headache.    [provider]  ibuprofen (ADVIL) 800 MG tablet Take 1 tablet (800 mg total) by mouth every 8 (eight) hours as needed. 04/11/22   Patton Salles, MD  Iron Combinations (IRON COMPLEX PO) Take by mouth in the morning and at bedtime.    [provider]  Multiple Vitamin (MULTIVITAMIN WITH MINERALS) TABS tablet Take 1 tablet by mouth daily. gummies    [provider]  ondansetron (ZOFRAN) 4 MG tablet Take 1 tablet (4 mg total) by mouth every 8 (eight) hours as needed for nausea or vomiting. 02/23/22   Tegeler, Canary Brim, MD  oxyCODONE-acetaminophen (PERCOCET/ROXICET) 5-325 MG tablet Take 1 tablet by mouth every 4 (four) hours as needed for severe pain. 04/11/22   Patton Salles, MD      Allergies     Myfembree [relugolix-estradiol-norethindrone] and Other    Review of Systems   Review of Systems  Skin:        Draining lesion on left breast  All other systems reviewed and are negative.   Physical Exam Updated Vital Signs BP (!) 125/92 (BP Location: Right Arm) Comment: Simultaneous filing. User may not have seen previous data.  Pulse 93 Comment: Simultaneous filing. User may not have seen previous data.  Temp 97.7 F (36.5 C) (Oral) Comment: Simultaneous filing. User may not have seen previous data. Comment (Src): Simultaneous filing. User may not have seen previous data.  Resp 16   Ht 1.626 m ( )   Wt 68 kg   LMP 04/09/2022 (Approximate)   SpO2 99%   BMI 25.75 kg/m  Physical Exam Vitals and nursing note reviewed. Exam conducted with a chaperone present.  Constitutional:      Appearance: She is well-developed.  HENT:     Head: Normocephalic and atraumatic.  Eyes:     Pupils: Pupils are equal, round, and reactive to light.  Cardiovascular:     Rate and Rhythm: Normal rate and regular rhythm.  Pulmonary:     Effort: Pulmonary effort is normal. No respiratory distress.  Chest:       Comments: Pea-sized lesion in the upper medial quadrant of the left breast adjacent to the nipple, no fluctuance or significant induration, there is scant  spontaneous drainage, 3 mm of erosion noted Abdominal:     Palpations: Abdomen is soft.  Musculoskeletal:     Cervical back: Neck supple.  Skin:    General: Skin is warm and dry.  Neurological:     Mental Status: She is alert and oriented to person, place, and time.     ED Results / Procedures / Treatments   Labs (all labs ordered are listed, but only abnormal results are displayed) Labs Reviewed - No data to display  EKG None  Radiology No results found.  Procedures Procedures    Medications Ordered in ED Medications  doxycycline (VIBRA-TABS) tablet 100 mg (has no administration in time range)    ED Course/  Medical Decision Making/ A&P                             Medical Decision Making Risk Prescription drug management.   This patient presents to the ED for concern of breast abscess, this involves an extensive number of treatment options, and is a complaint that carries with it a high risk of complications and morbidity.  I considered the following differential and admission for this acute, potentially life threatening condition.  The differential diagnosis includes abscess, cellulitis, underlying mass lesion  MDM:    This is a 47 year old female who presents with concern for lesion and drainage of the left breast.  She is nontoxic and vital signs are reassuring.  She denies any infectious symptoms.  Area of concern is somewhat atypical.  Certainly infection is a possibility although she appears to have more of an eroded area of skin.  There is no significant induration or fluctuance.  Will cover with doxycycline with concern for possible infection.  Given duration of noted abnormality at that area of the breast, I discussed with the patient the importance of of getting imaging either mammography or ultrasound.  She did have a hysterectomy in December and has an OB/GYN.  I have referred her back to her OB/GYN.  She was also given information for the breast center.  Patient stated understanding.  (Labs, imaging, consults)  Labs: I Ordered, and personally interpreted labs.  The pertinent results include: None  Imaging Studies ordered: I ordered imaging studies including none I independently visualized and interpreted imaging. I agree with the radiologist interpretation  Additional history obtained from chart review.  External records from outside source obtained and reviewed including direct me notes  Cardiac Monitoring: The patient was not maintained on a cardiac monitor.  If on the cardiac monitor, I personally viewed and interpreted the cardiac monitored which showed an underlying rhythm  of: N/A  Reevaluation: After the interventions noted above, I reevaluated the patient and found that they have :stayed the same  Social Determinants of Health:  lives independently  Disposition: Discharge  Co morbidities that complicate the patient evaluation  Past Medical History:  Diagnosis Date   Anemia 2023   taking iron supplements   Anxiety    mild   Endometriosis    Fibroids    GERD (gastroesophageal reflux disease)    Headache    migraine, usually around menstrual cycle   Heart murmur    as a child   High cholesterol    diet modification   History of endometrial biopsy 03/08/2022   benign proliferative endometrium     Medicines Meds ordered this encounter  Medications   doxycycline (VIBRA-TABS) tablet 100 mg   doxycycline (VIBRAMYCIN) 100  MG capsule    Sig: Take 1 capsule (100 mg total) by mouth 2 (two) times daily.    Dispense:  20 capsule    Refill:  0    I have reviewed the patients home medicines and have made adjustments as needed  Problem List / ED Course: Problem List Items Addressed This Visit   None Visit Diagnoses     Breast abscess    -  Primary                   Final Clinical Impression(s) / ED Diagnoses Final diagnoses:  Breast abscess    Rx / DC Orders ED Discharge Orders          Ordered    doxycycline (VIBRAMYCIN) 100 MG capsule  2 times daily        08/11/22 0136              Kacper Cartlidge, Mayer Masker, MD 08/11/22 [phone]

## 2022-08-11 NOTE — Discharge Instructions (Signed)
You were seen today for or concerns for infection of the breast.  Given how long you have had a lesion there, you need to have follow-up imaging with mammography or ultrasound to ensure no underlying mass.  In the meantime, take doxycycline for any potential infection.  Follow-up with your OB/GYN to arrange for breast imaging.

## 2022-08-11 NOTE — ED Triage Notes (Signed)
Pt arrived POV for a left breast abscess, pt reports had a "blackhead" for about 1 year on her breast, swelling to left breast as of last week and then yesterday evening the area "burst" and pt has had drainage. Pt reports painful and soreness. NAD noted at current, drainage to a minium at current. VSS

## 2022-10-05 ENCOUNTER — Other Ambulatory Visit: Payer: Self-pay | Admitting: Family Medicine

## 2022-10-05 DIAGNOSIS — N6320 Unspecified lump in the left breast, unspecified quadrant: Secondary | ICD-10-CM

## 2022-10-09 ENCOUNTER — Other Ambulatory Visit: Payer: No Typology Code available for payment source

## 2022-10-19 ENCOUNTER — Ambulatory Visit
Admission: RE | Admit: 2022-10-19 | Discharge: 2022-10-19 | Disposition: A | Discharge status: Home / Self Care | Payer: No Typology Code available for payment source | Source: Ambulatory Visit | Attending: Family Medicine | Admitting: Family Medicine

## 2022-10-19 ENCOUNTER — Ambulatory Visit: Payer: No Typology Code available for payment source

## 2022-10-19 DIAGNOSIS — N6320 Unspecified lump in the left breast, unspecified quadrant: Secondary | ICD-10-CM

## 2022-10-23 ENCOUNTER — Institutional Professional Consult (permissible substitution): Payer: No Typology Code available for payment source | Admitting: Plastic Surgery

## 2022-11-23 ENCOUNTER — Institutional Professional Consult (permissible substitution): Payer: No Typology Code available for payment source | Admitting: Plastic Surgery

## 2022-12-17 ENCOUNTER — Telehealth: Payer: No Typology Code available for payment source | Admitting: Nurse Practitioner

## 2022-12-17 DIAGNOSIS — J40 Bronchitis, not specified as acute or chronic: Secondary | ICD-10-CM | POA: Diagnosis not present

## 2022-12-17 MED ORDER — PROMETHAZINE-DM 6.25-15 MG/5ML PO SYRP: 5.0000 mL | ORAL_SOLUTION | Freq: Four times a day (QID) | ORAL | 0 refills | Status: AC | PRN

## 2022-12-17 MED ORDER — AZITHROMYCIN 250 MG PO TABS: ORAL_TABLET | ORAL | 0 refills | Status: AC

## 2022-12-17 NOTE — Progress Notes (Signed)
E-Visit for Cough  We are sorry that you are not feeling well.  Here is how we plan to help! I have prescribed a zpak or azithromycin which is an antibiotic. Amoxicillin is not a typical antibiotic used for bronchitis  Based on your presentation I believe you most likely have A cough due to bacteria.  When patients have a fever and a productive cough with a change in color or increased sputum production, we are concerned about bacterial bronchitis.  If left untreated it can progress to pneumonia.  If your symptoms do not improve with your treatment plan it is important that you contact your provider.   I have prescribed Azithromyin 250 mg: two tablets now and then one tablet daily for 4 additonal days    In addition you may use a prescription cough syrup I have sent to the pharmacy.  From your responses in the eVisit questionnaire you describe inflammation in the upper respiratory tract which is causing a significant cough.  This is commonly called Bronchitis and has four common causes:   Allergies Viral Infections Acid Reflux Bacterial Infection Allergies, viruses and acid reflux are treated by controlling symptoms or eliminating the cause. An example might be a cough caused by taking certain blood pressure medications. You stop the cough by changing the medication. Another example might be a cough caused by acid reflux. Controlling the reflux helps control the cough.  USE OF BRONCHODILATOR ("RESCUE") INHALERS: There is a risk from using your bronchodilator too frequently.  The risk is that over-reliance on a medication which only relaxes the muscles surrounding the breathing tubes can reduce the effectiveness of medications prescribed to reduce swelling and congestion of the tubes themselves.  Although you feel brief relief from the bronchodilator inhaler, your asthma may actually be worsening with the tubes becoming more swollen and filled with mucus.  This can delay other crucial treatments, such  as oral steroid medications. If you need to use a bronchodilator inhaler daily, several times per day, you should discuss this with your provider.  There are probably better treatments that could be used to keep your asthma under control.     HOME CARE Only take medications as instructed by your medical team. Complete the entire course of an antibiotic. Drink plenty of fluids and get plenty of rest. Avoid close contacts especially the very young and the elderly Cover your mouth if you cough or cough into your sleeve. Always remember to wash your hands A steam or ultrasonic humidifier can help congestion.   GET HELP RIGHT AWAY IF: You develop worsening fever. You become short of breath You cough up blood. Your symptoms persist after you have completed your treatment plan MAKE SURE YOU  Understand these instructions. Will watch your condition. Will get help right away if you are not doing well or get worse.    Thank you for choosing an e-visit.  Your e-visit answers were reviewed by a board certified advanced clinical practitioner to complete your personal care plan. Depending upon the condition, your plan could have included both over the counter or prescription medications.  Please review your pharmacy choice. Make sure the pharmacy is open so you can pick up prescription now. If there is a problem, you may contact your provider through [employer] and have the prescription routed to another pharmacy.  Your safety is important to [place]. If you have drug allergies check your prescription carefully.   For the next 24 hours you can use MyChart to  ask questions about today's visit, request a non-urgent call back, or ask for a work or school excuse. You will get an email in the next two days asking about your experience. I hope that your e-visit has been valuable and will speed your recovery.

## 2022-12-17 NOTE — Progress Notes (Signed)
I have spent 5 minutes in review of e-visit questionnaire, review and updating patient chart, medical decision making and response to patient.  ° °Zelda W Fleming, NP ° °  °

## 2022-12-21 ENCOUNTER — Ambulatory Visit (INDEPENDENT_AMBULATORY_CARE_PROVIDER_SITE_OTHER): Payer: No Typology Code available for payment source | Admitting: Plastic Surgery

## 2022-12-21 ENCOUNTER — Encounter: Payer: Self-pay | Admitting: Plastic Surgery

## 2022-12-21 VITALS — BP 124/84 | HR 106 | Ht 64.0 in | Wt 150.0 lb

## 2022-12-21 DIAGNOSIS — L989 Disorder of the skin and subcutaneous tissue, unspecified: Secondary | ICD-10-CM

## 2022-12-21 DIAGNOSIS — N6322 Unspecified lump in the left breast, upper inner quadrant: Secondary | ICD-10-CM

## 2022-12-21 NOTE — Progress Notes (Signed)
Referring Provider Noni Saupe, MD [phone] [place],  [place] [address]   CC:  Chief Complaint  Patient presents with   Advice Only      Jillian Vega is an 47 y.o. female.  HPI: Ms. Jillian Vega is a 47 year old female who is referred for evaluation management of a cyst on the upper inner quadrant of the left breast.  She states that she developed what appeared to be a blackhead on her breast approximately a year ago.  Several months ago she noted that it was enlarging in size and eventually began draining a brown looking material.  She has been seen by her primary care doctor and urgent care has been prescribed antibiotics and referred for definitive treatment of the wound.  She was referred for a mammogram which was unremarkable except for findings consistent with a sebaceous cyst in the left breast.  Allergies  Allergen Reactions   Myfembree [Relugolix-Estradiol-Norethindrone] Other (See Comments)    Back and side pain per patient.   Other Dermatitis    Dye in clothes (not medical)    Outpatient Encounter Medications as of 12/21/2022  Medication Sig   aspirin-acetaminophen-caffeine (EXCEDRIN MIGRAINE) 250-250-65 MG per tablet Take 1 tablet by mouth every 6 (six) hours as needed. Patient used this medication for a severe headache.   azithromycin (ZITHROMAX) 250 MG tablet Take 2 tablets on day 1, then 1 tablet daily on days 2 through 5   doxycycline (VIBRAMYCIN) 100 MG capsule Take 1 capsule (100 mg total) by mouth 2 (two) times daily.   ibuprofen (ADVIL) 800 MG tablet Take 1 tablet (800 mg total) by mouth every 8 (eight) hours as needed.   Iron Combinations (IRON COMPLEX PO) Take by mouth in the morning and at bedtime.   Multiple Vitamin (MULTIVITAMIN WITH MINERALS) TABS tablet Take 1 tablet by mouth daily. gummies   ondansetron (ZOFRAN) 4 MG tablet Take 1 tablet (4 mg total) by mouth every 8 (eight) hours as needed for nausea or vomiting.    oxyCODONE-acetaminophen (PERCOCET/ROXICET) 5-325 MG tablet Take 1 tablet by mouth every 4 (four) hours as needed for severe pain.   promethazine-dextromethorphan (PROMETHAZINE-DM) 6.25-15 MG/5ML syrup Take 5 mLs by mouth 4 (four) times daily as needed for cough.   No facility-administered encounter medications on file as of 12/21/2022.     Past Medical History:  Diagnosis Date   Anemia 2023   taking iron supplements   Anxiety    mild   Endometriosis    Fibroids    GERD (gastroesophageal reflux disease)    Headache    migraine, usually around menstrual cycle   Heart murmur    as a child   High cholesterol    diet modification   History of endometrial biopsy 03/08/2022   benign proliferative endometrium    Past Surgical History:  Procedure Laterality Date   ABDOMINAL HYSTERECTOMY     COLPOSCOPY  2020   3- 4 during a study for endometriosis   CYSTOSCOPY N/A 04/11/2022   Procedure: CYSTOSCOPY;  Surgeon: Patton Salles, MD;  Location: [facility];  Service: Gynecology;  Laterality: N/A;   ENDOMETRIAL BIOPSY  03/08/2022   benign proliferative endometrium   LAPAROSCOPY  1999   OVARIAN CYST SURGERY  1999   TOTAL LAPAROSCOPIC HYSTERECTOMY WITH SALPINGECTOMY Bilateral 04/11/2022   Procedure: TOTAL LAPAROSCOPIC HYSTERECTOMY WITH bilateral SALPINGECTOMY;  Surgeon: Patton Salles, MD;  Location: [facility] [place];  Service:  Gynecology;  Laterality: Bilateral;    Family History  Problem Relation Age of Onset   Osteoporosis Mother    Cancer Maternal Grandmother        ovarian    Social History   Social History Narrative   Not on file     Review of Systems General: Denies fevers, chills, weight loss CV: Denies chest pain, shortness of breath, palpitations Skin: Small lesion on the upper inner portion of the left breast  Physical Exam    12/21/2022    2:40 PM 08/11/2022    1:22 AM 08/11/2022    1:20 AM  Vitals with BMI  Height  5\' 4"  5\' 4"    Weight 150 lbs 150 lbs   BMI 25.73 25.73   Systolic 124  125  Diastolic 84  92  Pulse 106  93    General:  No acute distress,  Alert and oriented, Non-Toxic, Normal speech and affect Integument: There is an opening with surrounding firmness on the upper inner quadrant of the left breast.  There is no fluid or purulent material expressed with palpation of the wound.  The remainder of the breast is unremarkable. Mammogram: June 2024 BI-RADS 2 with additional findings as noted above Assessment/Plan Skin lesion upper inner quadrant left breast: Patient most likely has an epidermal inclusion cyst which is drained.  I have recommended to her that we reexcised the area to include removing the cyst wall.  This can be done under local anesthesia in the office.  The patient will have a small scar after the procedure.  She understands that there is a risk of recurrence.  There is also the possibility that she will require a more extensive procedure based on the pathology of the lesion.  [name] 12/21/2022, 2:53 PM

## 2023-01-25 ENCOUNTER — Ambulatory Visit (INDEPENDENT_AMBULATORY_CARE_PROVIDER_SITE_OTHER): Payer: No Typology Code available for payment source | Admitting: Plastic Surgery

## 2023-01-25 VITALS — BP 130/87 | HR 86

## 2023-01-25 DIAGNOSIS — L989 Disorder of the skin and subcutaneous tissue, unspecified: Secondary | ICD-10-CM

## 2023-01-25 DIAGNOSIS — L720 Epidermal cyst: Secondary | ICD-10-CM

## 2023-01-25 NOTE — Progress Notes (Signed)
Procedure Note  Preoperative Dx: Left medial breast cyst  Postoperative Dx: Same  Procedure: Excision of cyst  Anesthesia: Lidocaine 1% with 1:100,000 epinephrine and 0.25% Sensorcaine   Indication for Procedure: Removal for pathologic diagnosis  Description of Procedure: Risks and complications were explained to the patient including the need for additional procedures.  Consent was confirmed and the patient understands the risks and benefits.  The potential complications and alternatives were explained and the patient consents.  The patient expressed understanding the option of not having the procedure and the risks of a scar.  Time out was called and all information was confirmed to be correct.    The area was prepped and drapped.  Local anesthetic was injected in the subcutaneous tissues.  After waiting for the local to take affect a 1 cm elliptical incision was made around the skin lesion and dissection carried out down to the subcutaneous tissues to completely remove the cyst and cyst walls.  After obtaining hemostasis, the surgical wound was closed in layers with 4-0 Monocryl suture.  The surgical wound measured 1 cm.  A dressing was applied.  The patient was given instructions on how to care for the area and a follow up appointment.  Jillian Vega tolerated the procedure well and there were no complications. The specimen was sent to pathology.

## 2023-01-30 LAB — DERMATOLOGY PATHOLOGY

## 2023-02-01 ENCOUNTER — Ambulatory Visit: Payer: No Typology Code available for payment source | Admitting: Plastic Surgery

## 2023-04-12 ENCOUNTER — Ambulatory Visit: Payer: BC Managed Care – PPO | Admitting: Nurse Practitioner

## 2023-04-18 ENCOUNTER — Encounter: Payer: Self-pay | Admitting: Nurse Practitioner

## 2023-04-18 ENCOUNTER — Ambulatory Visit (INDEPENDENT_AMBULATORY_CARE_PROVIDER_SITE_OTHER): Payer: No Typology Code available for payment source | Admitting: Nurse Practitioner

## 2023-04-18 VITALS — BP 128/82 | HR 74 | Wt 158.0 lb

## 2023-04-18 DIAGNOSIS — N809 Endometriosis, unspecified: Secondary | ICD-10-CM | POA: Diagnosis not present

## 2023-04-18 DIAGNOSIS — F4321 Adjustment disorder with depressed mood: Secondary | ICD-10-CM | POA: Diagnosis not present

## 2023-04-18 DIAGNOSIS — Z1331 Encounter for screening for depression: Secondary | ICD-10-CM | POA: Diagnosis not present

## 2023-04-18 DIAGNOSIS — F321 Major depressive disorder, single episode, moderate: Secondary | ICD-10-CM

## 2023-04-18 DIAGNOSIS — F410 Panic disorder [episodic paroxysmal anxiety] without agoraphobia: Secondary | ICD-10-CM | POA: Diagnosis not present

## 2023-04-18 MED ORDER — SERTRALINE HCL 50 MG PO TABS: 50.0000 mg | ORAL_TABLET | Freq: Every day | ORAL | 0 refills | Status: AC

## 2023-04-18 MED ORDER — NORETHINDRONE 0.35 MG PO TABS: 1.0000 | ORAL_TABLET | Freq: Every day | ORAL | 0 refills | Status: AC

## 2023-04-18 NOTE — Progress Notes (Signed)
   Acute Office Visit  Subjective:    Patient ID: Jillian Vega, female    DOB: [birth year], 47 y.o.   MRN: [id number]   HPI 47 y.o. Jillian Vega presents today for pelvic pain and mood changes. S/P 03/2022 TAH for fibroids and endometriosis. Has started noticing pelvic pain each month with last month being most severe. Feels similar to her endometriosis pain in the past. Does get cyclic PMS symptoms as well. Lost grandmother this year, who she was very close to, as well as job change and other personal stressors. Has been experiencing symptoms of depression and anxiety. Plus poor sleep and low energy. PHQ score of 15. Starting seeing a therapist through work last week.   Patient's last menstrual period was 04/09/2022 (approximate).    Review of Systems  Constitutional:  Positive for fatigue.  Genitourinary:  Positive for pelvic pain.  Psychiatric/Behavioral:  Positive for dysphoric mood and sleep disturbance. The patient is nervous/anxious.        Objective:    Physical Exam Constitutional:      Appearance: Normal appearance.  Psychiatric:        Attention and Perception: Attention normal.        Mood and Affect: Mood is depressed. Affect is tearful.        Speech: Speech normal.        Behavior: Behavior normal.   GU: Not indicated  BP 128/82   Pulse 74   Wt 158 lb (71.7 kg)   LMP 04/09/2022 (Approximate)   SpO2 100%   BMI 27.12 kg/m  Wt Readings from Last 3 Encounters:  04/18/23 158 lb (71.7 kg)  12/21/22 150 lb (68 kg)  08/11/22 150 lb (68 kg)         Assessment & Plan:   Problem List Items Addressed This Visit   None Visit Diagnoses       Current moderate episode of major depressive disorder without prior episode (HCC)    -  Primary   Relevant Medications   sertraline (ZOLOFT) 50 MG tablet     Endometriosis determined by laparoscopy       Relevant Medications   norethindrone (ORTHO MICRONOR) 0.35 MG tablet     Panic attacks       Relevant Medications    sertraline (ZOLOFT) 50 MG tablet     Grief          Plan: Discussed possible symptoms of endometriosis. Will start POPs for management. Mood changes likely secondary to grief from losing her grandmother and other personal stressors. Continue seeing therapy. Start Zoloft 50 mg daily. Aware of initial side effects and recommended time of use for effectiveness. Follow up in 6 weeks.   Return in about 6 weeks (around 05/30/2023) for Med follow up.    Olivia Mackie DNP, 9:05 AM 04/18/2023

## 2023-04-19 ENCOUNTER — Telehealth: Payer: Self-pay

## 2023-04-19 NOTE — Telephone Encounter (Signed)
Appointment Request From: Janice Norrie   With Provider: Olivia Mackie [facility] [place]]   Preferred Date Range: 04/20/2023 - 05/02/2023   Preferred Times: Any Time   Reason for visit: Office Visit   Health Maintenance Topic:    Comments: I need to talk about short term leave dealing with too much need a break

## 2023-05-08 ENCOUNTER — Encounter: Payer: Self-pay | Admitting: Nurse Practitioner

## 2023-05-21 ENCOUNTER — Telehealth: Payer: Self-pay | Admitting: Nurse Practitioner

## 2023-05-21 NOTE — Telephone Encounter (Signed)
Left two messages and letter sent for patient to call and schedule follow up appointment. Patient has not called back.

## 2023-07-15 ENCOUNTER — Emergency Department (HOSPITAL_COMMUNITY)

## 2023-07-15 ENCOUNTER — Emergency Department (HOSPITAL_COMMUNITY)
Admission: EM | Admit: 2023-07-15 | Discharge: 2023-07-15 | Disposition: A | Discharge status: Home / Self Care | Attending: Student | Admitting: Student

## 2023-07-15 DIAGNOSIS — R109 Unspecified abdominal pain: Secondary | ICD-10-CM | POA: Diagnosis present

## 2023-07-15 DIAGNOSIS — R112 Nausea with vomiting, unspecified: Secondary | ICD-10-CM | POA: Insufficient documentation

## 2023-07-15 DIAGNOSIS — Z7982 Long term (current) use of aspirin: Secondary | ICD-10-CM | POA: Diagnosis not present

## 2023-07-15 DIAGNOSIS — D72829 Elevated white blood cell count, unspecified: Secondary | ICD-10-CM | POA: Insufficient documentation

## 2023-07-15 DIAGNOSIS — R000 Tachycardia, unspecified: Secondary | ICD-10-CM | POA: Diagnosis not present

## 2023-07-15 LAB — URINALYSIS, ROUTINE W REFLEX MICROSCOPIC
Bilirubin Urine: NEGATIVE
Glucose, UA: NEGATIVE mg/dL
Hgb urine dipstick: NEGATIVE
Ketones, ur: NEGATIVE mg/dL
Leukocytes,Ua: NEGATIVE
Nitrite: NEGATIVE
Protein, ur: NEGATIVE mg/dL
Specific Gravity, Urine: 1.013 (ref 1.005–1.030)
pH: 8 (ref 5.0–8.0)

## 2023-07-15 LAB — CBC WITH DIFFERENTIAL/PLATELET
Abs Immature Granulocytes: 0.04 10*3/uL (ref 0.00–0.07)
Basophils Absolute: 0 10*3/uL (ref 0.0–0.1)
Basophils Relative: 0 %
Eosinophils Absolute: 0 10*3/uL (ref 0.0–0.5)
Eosinophils Relative: 0 %
HCT: 45.2 % (ref 36.0–46.0)
Hemoglobin: 13.9 g/dL (ref 12.0–15.0)
Immature Granulocytes: 0 %
Lymphocytes Relative: 6 %
Lymphs Abs: 0.9 10*3/uL (ref 0.7–4.0)
MCH: 22.2 pg — ABNORMAL LOW (ref 26.0–34.0)
MCHC: 30.8 g/dL (ref 30.0–36.0)
MCV: 72.1 fL — ABNORMAL LOW (ref 80.0–100.0)
Monocytes Absolute: 0.2 10*3/uL (ref 0.1–1.0)
Monocytes Relative: 2 %
Neutro Abs: 12.7 10*3/uL — ABNORMAL HIGH (ref 1.7–7.7)
Neutrophils Relative %: 92 %
Platelets: 244 10*3/uL (ref 150–400)
RBC: 6.27 MIL/uL — ABNORMAL HIGH (ref 3.87–5.11)
RDW: 15.9 % — ABNORMAL HIGH (ref 11.5–15.5)
WBC: 13.9 10*3/uL — ABNORMAL HIGH (ref 4.0–10.5)
nRBC: 0 % (ref 0.0–0.2)

## 2023-07-15 LAB — COMPREHENSIVE METABOLIC PANEL
ALT: 16 U/L (ref 0–44)
AST: 21 U/L (ref 15–41)
Albumin: 4.2 g/dL (ref 3.5–5.0)
Alkaline Phosphatase: 95 U/L (ref 38–126)
Anion gap: 13 (ref 5–15)
BUN: 13 mg/dL (ref 6–20)
CO2: 23 mmol/L (ref 22–32)
Calcium: 9.9 mg/dL (ref 8.9–10.3)
Chloride: 102 mmol/L (ref 98–111)
Creatinine, Ser: 0.85 mg/dL (ref 0.44–1.00)
GFR, Estimated: >60 mL/min (ref >60)
Glucose, Bld: 116 mg/dL — ABNORMAL HIGH (ref 70–99)
Potassium: 4.1 mmol/L (ref 3.5–5.1)
Sodium: 138 mmol/L (ref 135–145)
Total Bilirubin: 0.7 mg/dL (ref 0.0–1.2)
Total Protein: 8 g/dL (ref 6.5–8.1)

## 2023-07-15 LAB — RAPID URINE DRUG SCREEN, HOSP PERFORMED
Amphetamines: NOT DETECTED
Barbiturates: NOT DETECTED
Benzodiazepines: NOT DETECTED
Cocaine: NOT DETECTED
Opiates: POSITIVE — AB
Tetrahydrocannabinol: NOT DETECTED

## 2023-07-15 LAB — LIPASE, BLOOD: Lipase: 27 U/L (ref 11–51)

## 2023-07-15 MED ORDER — IOHEXOL 300 MG/ML  SOLN
100.0000 mL | Freq: Once | INTRAMUSCULAR | Status: AC | PRN
  Administered 2023-07-15: 85 mL via INTRAVENOUS

## 2023-07-15 MED ORDER — ONDANSETRON 4 MG PO TBDP: 4.0000 mg | ORAL_TABLET | Freq: Three times a day (TID) | ORAL | 0 refills | Status: AC | PRN

## 2023-07-15 MED ORDER — LACTATED RINGERS IV BOLUS
2000.0000 mL | Freq: Once | INTRAVENOUS | Status: AC
  Administered 2023-07-15: 2000 mL via INTRAVENOUS

## 2023-07-15 MED ORDER — MORPHINE SULFATE (PF) 4 MG/ML IV SOLN
4.0000 mg | Freq: Once | INTRAVENOUS | Status: AC
  Administered 2023-07-15: 4 mg via INTRAVENOUS
  Filled 2023-07-15: qty 1

## 2023-07-15 MED ORDER — ONDANSETRON HCL 4 MG/2ML IJ SOLN
4.0000 mg | Freq: Once | INTRAMUSCULAR | Status: AC
  Administered 2023-07-15: 4 mg via INTRAVENOUS
  Filled 2023-07-15: qty 2

## 2023-07-15 MED ORDER — DICYCLOMINE HCL 20 MG PO TABS: 20.0000 mg | ORAL_TABLET | Freq: Two times a day (BID) | ORAL | 0 refills | Status: AC

## 2023-07-15 NOTE — ED Provider Notes (Signed)
 [facility] AT [facility] Provider Note   CSN: [id number] Arrival date & time: 07/15/23  5621     History  Chief Complaint  Patient presents with   Abdominal Pain   Emesis    Jillian Vega is a 48 y.o. female.  48 year old female presents today for concern of abdominal pain, nausea and vomiting that started this morning.  She states last night she was out with her friends for hibachi and alcoholic drinks.  She states she did not drink significant amount.  She states both her and her friend have noted some affect since the meal last night.  She is unsure if it was a food or if there was the drinks.  She states she is sensitive to what she eats.  Denies any hematemesis.  Appears uncomfortable on exam.  The history is provided by the patient. No language interpreter was used.       Home Medications Prior to Admission medications   Medication Sig Start Date End Date Taking? Authorizing Provider  aspirin-acetaminophen-caffeine (EXCEDRIN MIGRAINE) [phone] MG per tablet Take 1 tablet by mouth every 6 (six) hours as needed for migraine. Patient used this medication for a severe headache.   Yes [provider]  dicyclomine (BENTYL) 20 MG tablet Take 1 tablet (20 mg total) by mouth 2 (two) times daily. 07/15/23  Yes Kule Gascoigne, PA-C  Elderberry 500 MG CAPS Take by mouth.   Yes [provider]  Elderberry-Vitamin C-Zinc (ELDERBERRY IMMUNE HEALTH GUMMY PO) Take 2 capsules by mouth daily.   Yes [provider]  Iron Combinations (IRON COMPLEX PO) Take by mouth in the morning and at bedtime.   Yes [provider]  ondansetron (ZOFRAN) 4 MG tablet Take 1 tablet (4 mg total) by mouth every 8 (eight) hours as needed for nausea or vomiting. 02/23/22  Yes Tegeler, Canary Brim, MD  ondansetron (ZOFRAN-ODT) 4 MG disintegrating tablet Take 1 tablet (4 mg total) by mouth every 8 (eight) hours as needed. 07/15/23  Yes Jethro Radke, PA-C   ibuprofen (ADVIL) 800 MG tablet Take 1 tablet (800 mg total) by mouth every 8 (eight) hours as needed. Patient not taking: Reported on 07/15/2023 04/11/22   Patton Salles, MD  norethindrone (ORTHO MICRONOR) 0.35 MG tablet Take 1 tablet (0.35 mg total) by mouth daily. Patient not taking: Reported on 07/15/2023 04/18/23   Wyline Beady A, NP  promethazine-dextromethorphan (PROMETHAZINE-DM) 6.25-15 MG/5ML syrup Take 5 mLs by mouth 4 (four) times daily as needed for cough. Patient not taking: Reported on 07/15/2023 12/17/22   Claiborne Rigg, NP  sertraline (ZOLOFT) 50 MG tablet Take 1 tablet (50 mg total) by mouth daily. Patient not taking: Reported on 07/15/2023 04/18/23   Olivia Mackie, NP      Allergies    Myfembree [relugolix-estradiol-norethindrone] and Other    Review of Systems   Review of Systems  Constitutional:  Negative for chills and fever.  Respiratory:  Negative for shortness of breath.   Cardiovascular:  Negative for chest pain.  Gastrointestinal:  Positive for abdominal pain, nausea and vomiting.  Neurological:  Negative for light-headedness.  All other systems reviewed and are negative.   Physical Exam Updated Vital Signs BP 106/79 (BP Location: Right Arm)   Pulse (!) 107   Temp 99.3 F (37.4 C) (Oral)   Resp 20   LMP 04/09/2022 (Approximate)   SpO2 100%  Physical Exam Vitals and nursing note reviewed.  Constitutional:  General: She is not in acute distress.    Appearance: Normal appearance. She is not ill-appearing.  HENT:     Head: Normocephalic and atraumatic.     Nose: Nose normal.  Eyes:     General: No scleral icterus.    Extraocular Movements: Extraocular movements intact.     Conjunctiva/sclera: Conjunctivae normal.  Cardiovascular:     Rate and Rhythm: Regular rhythm. Tachycardia present.     Pulses: Normal pulses.  Pulmonary:     Effort: Pulmonary effort is normal. No respiratory distress.     Breath sounds: Normal breath  sounds. No wheezing or rales.  Abdominal:     General: There is no distension.     Tenderness: There is abdominal tenderness. There is no right CVA tenderness, left CVA tenderness or guarding.  Musculoskeletal:        General: Normal range of motion.     Cervical back: Normal range of motion.  Skin:    General: Skin is warm and dry.  Neurological:     General: No focal deficit present.     Mental Status: She is alert. Mental status is at baseline.     ED Results / Procedures / Treatments   Labs (all labs ordered are listed, but only abnormal results are displayed) Labs Reviewed  CBC WITH DIFFERENTIAL/PLATELET - Abnormal; Notable for the following components:      Result Value   WBC 13.9 (*)    RBC 6.27 (*)    MCV 72.1 (*)    MCH 22.2 (*)    RDW 15.9 (*)    Neutro Abs 12.7 (*)    All other components within normal limits  COMPREHENSIVE METABOLIC PANEL - Abnormal; Notable for the following components:   Glucose, Bld 116 (*)    All other components within normal limits  RAPID URINE DRUG SCREEN, HOSP PERFORMED - Abnormal; Notable for the following components:   Opiates POSITIVE (*)    All other components within normal limits  LIPASE, BLOOD  URINALYSIS, ROUTINE W REFLEX MICROSCOPIC    EKG None  Radiology CT ABDOMEN PELVIS W CONTRAST Result Date: 07/15/2023 CLINICAL DATA:  Abdominal pain, acute, nonlocalized. EXAM: CT ABDOMEN AND PELVIS WITH CONTRAST TECHNIQUE: Multidetector CT imaging of the abdomen and pelvis was performed using the standard protocol following bolus administration of intravenous contrast. RADIATION DOSE REDUCTION: This exam was performed according to the departmental dose-optimization program which includes automated exposure control, adjustment of the mA and/or kV according to patient size and/or use of iterative reconstruction technique. CONTRAST:  85mL OMNIPAQUE IOHEXOL 300 MG/ML  SOLN COMPARISON:  None Available. FINDINGS: Lower chest: The lung bases are  clear. No pleural effusion. The heart is normal in size. No pericardial effusion. Hepatobiliary: The liver is normal in size. Non-cirrhotic configuration. No suspicious mass. These is mild diffuse hepatic steatosis. No intrahepatic or extrahepatic bile duct dilation. No calcified gallstones. Normal gallbladder wall thickness. No pericholecystic inflammatory changes. Pancreas: Unremarkable. No pancreatic ductal dilatation or surrounding inflammatory changes. Spleen: Within normal limits. No focal lesion. Adrenals/Urinary Tract: Adrenal glands are unremarkable. No suspicious renal mass. There is a subcentimeter hypoattenuating structure in the left kidney upper pole, too small to adequately characterize but favored to represent a cyst. No hydronephrosis. There is excreted contrast in bilateral renal collecting systems, left more than right, limiting the evaluation for nonobstructing calculi. No obstructive calculus within. Urinary bladder is under distended, precluding optimal assessment. However, no large mass or stones identified. No perivesical fat stranding. Stomach/Bowel: No  disproportionate dilation of the small or large bowel loops. No evidence of abnormal bowel wall thickening or inflammatory changes. The appendix is unremarkable. Vascular/Lymphatic: No ascites or pneumoperitoneum. No abdominal or pelvic lymphadenopathy, by size criteria. No aneurysmal dilation of the major abdominal arteries. Reproductive: The uterus is surgically absent. No large adnexal mass. Other: There is a tiny fat containing umbilical hernia. The soft tissues and abdominal wall are otherwise unremarkable. Musculoskeletal: No suspicious osseous lesions. IMPRESSION: *No acute inflammatory process identified within the abdomen or pelvis. *Multiple other nonacute observations, as described above. Electronically Signed   By: Jules Schick M.D.   On: 07/15/2023 10:13    Procedures Procedures    Medications Ordered in ED Medications   morphine (PF) 4 MG/ML injection 4 mg (4 mg Intravenous Given 07/15/23 0751)  lactated ringers bolus 2,000 mL (0 mLs Intravenous Stopped 07/15/23 1049)  ondansetron (ZOFRAN) injection 4 mg (4 mg Intravenous Given 07/15/23 0749)  iohexol (OMNIPAQUE) 300 MG/ML solution 100 mL (85 mLs Intravenous Contrast Given 07/15/23 0949)    ED Course/ Medical Decision Making/ A&P                                 Medical Decision Making Amount and/or Complexity of Data Reviewed Labs: ordered. Radiology: ordered.  Risk Prescription drug management.   Medical Decision Making / ED Course   This patient presents to the ED for concern of abdominal pain, this involves an extensive number of treatment options, and is a complaint that carries with it a high risk of complications and morbidity.  The differential diagnosis includes gastroenteritis, colitis, diverticulitis, appendicitis, cholecystitis, constipation, UTI  MDM: 48 year old female presents today for concern of abdominal pain.  Hemodynamically stable.  Mild tachycardia noted.  Uncomfortable on exam.  Will obtain labs, provide symptom control and reevaluate.  Will provide fluids.  Significant improvement on reevaluation.  She does have leukocytosis with left shift.  Will order CT to rule out acute concerning cause of her pain.  UA without acute concerns.  CMP without acute concern other than glucose of 116.  Lipase within normal.  Remains with significant improvement without additional medications.  CT without acute concerns.  She is appropriate for discharge.  Symptomatic management prescribed.  Patient voices understanding and is in agreement with plan.    Lab Tests: -I ordered, reviewed, and interpreted labs.   The pertinent results include:   Labs Reviewed  CBC WITH DIFFERENTIAL/PLATELET - Abnormal; Notable for the following components:      Result Value   WBC 13.9 (*)    RBC 6.27 (*)    MCV 72.1 (*)    MCH 22.2 (*)    RDW 15.9 (*)     Neutro Abs 12.7 (*)    All other components within normal limits  COMPREHENSIVE METABOLIC PANEL - Abnormal; Notable for the following components:   Glucose, Bld 116 (*)    All other components within normal limits  RAPID URINE DRUG SCREEN, HOSP PERFORMED - Abnormal; Notable for the following components:   Opiates POSITIVE (*)    All other components within normal limits  LIPASE, BLOOD  URINALYSIS, ROUTINE W REFLEX MICROSCOPIC      EKG  EKG Interpretation Date/Time:    Ventricular Rate:    PR Interval:    QRS Duration:    QT Interval:    QTC Calculation:   R Axis:      Text Interpretation:  Imaging Studies ordered: I ordered imaging studies including CT abdomen pelvis with contrast I independently visualized and interpreted imaging. I agree with the radiologist interpretation   Medicines ordered and prescription drug management: Meds ordered this encounter  Medications   morphine (PF) 4 MG/ML injection 4 mg   lactated ringers bolus 2,000 mL   ondansetron (ZOFRAN) injection 4 mg   iohexol (OMNIPAQUE) 300 MG/ML solution 100 mL   ondansetron (ZOFRAN-ODT) 4 MG disintegrating tablet    Sig: Take 1 tablet (4 mg total) by mouth every 8 (eight) hours as needed.    Dispense:  20 tablet    Refill:  0    Supervising Provider:   Hyacinth Meeker, BRIAN [[id number]]   dicyclomine (BENTYL) 20 MG tablet    Sig: Take 1 tablet (20 mg total) by mouth 2 (two) times daily.    Dispense:  20 tablet    Refill:  0    Supervising Provider:   Eber Hong [[id number]]    -I have reviewed the patients home medicines and have made adjustments as needed  Reevaluation: After the interventions noted above, I reevaluated the patient and found that they have :resolved  Co morbidities that complicate the patient evaluation  Past Medical History:  Diagnosis Date   Anemia 2023   taking iron supplements   Anxiety    mild   Endometriosis    Fibroids    GERD (gastroesophageal reflux disease)     Headache    migraine, usually around menstrual cycle   Heart murmur    as a child   High cholesterol    diet modification   History of endometrial biopsy 03/08/2022   benign proliferative endometrium      Dispostion: Discharged in stable condition.  Return precaution discussed.  Patient voices understanding and is in agreement with plan.   Final Clinical Impression(s) / ED Diagnoses Final diagnoses:  Abdominal pain, unspecified abdominal location    Rx / DC Orders ED Discharge Orders          Ordered    ondansetron (ZOFRAN-ODT) 4 MG disintegrating tablet  Every 8 hours PRN        07/15/23 1131    dicyclomine (BENTYL) 20 MG tablet  2 times daily        07/15/23 1131              Marita [place], [place] 07/15/23 1151    Kommor, [place], MD 07/15/23 2111

## 2023-07-15 NOTE — ED Notes (Signed)
 PT to CT.

## 2023-07-15 NOTE — ED Triage Notes (Signed)
 Pt states that she went out to eat hibachi last night and then began feeling ill. She went out and had half of one alcoholic drink before becoming ill with N/V/D.

## 2023-07-15 NOTE — Discharge Instructions (Signed)
 Your workup today was reassuring.  No concerning cause of your abdominal pain.  Your abdominal pain did improve after medicines in the emergency department.  Follow-up with your primary care doctor.  Return for any emergent symptoms.

## 2024-06-04 ENCOUNTER — Emergency Department (HOSPITAL_BASED_OUTPATIENT_CLINIC_OR_DEPARTMENT_OTHER)
Admission: EM | Admit: 2024-06-04 | Discharge: 2024-06-04 | Disposition: A | Discharge status: Home / Self Care | Attending: Emergency Medicine | Admitting: Emergency Medicine

## 2024-06-04 ENCOUNTER — Emergency Department (HOSPITAL_BASED_OUTPATIENT_CLINIC_OR_DEPARTMENT_OTHER)

## 2024-06-04 ENCOUNTER — Encounter (HOSPITAL_BASED_OUTPATIENT_CLINIC_OR_DEPARTMENT_OTHER): Payer: Self-pay | Admitting: Emergency Medicine

## 2024-06-04 ENCOUNTER — Other Ambulatory Visit: Payer: Self-pay

## 2024-06-04 DIAGNOSIS — Z7982 Long term (current) use of aspirin: Secondary | ICD-10-CM | POA: Insufficient documentation

## 2024-06-04 DIAGNOSIS — G43009 Migraine without aura, not intractable, without status migrainosus: Secondary | ICD-10-CM | POA: Insufficient documentation

## 2024-06-04 LAB — CBC WITH DIFFERENTIAL/PLATELET
Abs Immature Granulocytes: 0.03 10*3/uL (ref 0.00–0.07)
Basophils Absolute: 0.1 10*3/uL (ref 0.0–0.1)
Basophils Relative: 1 %
Eosinophils Absolute: 0.1 10*3/uL (ref 0.0–0.5)
Eosinophils Relative: 1 %
HCT: 44.9 % (ref 36.0–46.0)
Hemoglobin: 13.8 g/dL (ref 12.0–15.0)
Immature Granulocytes: 0 %
Lymphocytes Relative: 26 %
Lymphs Abs: 2.6 10*3/uL (ref 0.7–4.0)
MCH: 22.3 pg — ABNORMAL LOW (ref 26.0–34.0)
MCHC: 30.7 g/dL (ref 30.0–36.0)
MCV: 72.5 fL — ABNORMAL LOW (ref 80.0–100.0)
Monocytes Absolute: 0.5 10*3/uL (ref 0.1–1.0)
Monocytes Relative: 5 %
Neutro Abs: 6.6 10*3/uL (ref 1.7–7.7)
Neutrophils Relative %: 67 %
Platelets: 241 10*3/uL (ref 150–400)
RBC: 6.19 MIL/uL — ABNORMAL HIGH (ref 3.87–5.11)
RDW: 15.2 % (ref 11.5–15.5)
WBC: 9.8 10*3/uL (ref 4.0–10.5)
nRBC: 0 % (ref 0.0–0.2)

## 2024-06-04 LAB — BASIC METABOLIC PANEL WITH GFR
Anion gap: 9 (ref 5–15)
BUN: 8 mg/dL (ref 6–20)
CO2: 28 mmol/L (ref 22–32)
Calcium: 9.7 mg/dL (ref 8.9–10.3)
Chloride: 105 mmol/L (ref 98–111)
Creatinine, Ser: 0.75 mg/dL (ref 0.44–1.00)
GFR, Estimated: >60 mL/min
Glucose, Bld: 92 mg/dL (ref 70–99)
Potassium: 4.5 mmol/L (ref 3.5–5.1)
Sodium: 142 mmol/L (ref 135–145)

## 2024-06-04 LAB — RESP PANEL BY RT-PCR (RSV, FLU A&B, COVID)  RVPGX2
Influenza A by PCR: NEGATIVE
Influenza B by PCR: NEGATIVE
Resp Syncytial Virus by PCR: NEGATIVE
SARS Coronavirus 2 by RT PCR: NEGATIVE

## 2024-06-04 MED ORDER — DEXAMETHASONE SOD PHOSPHATE PF 10 MG/ML IJ SOLN
10.0000 mg | Freq: Once | INTRAMUSCULAR | Status: AC
  Administered 2024-06-04: 10 mg via INTRAVENOUS
  Filled 2024-06-04: qty 1

## 2024-06-04 MED ORDER — IOHEXOL 350 MG/ML SOLN
75.0000 mL | Freq: Once | INTRAVENOUS | Status: AC | PRN
  Administered 2024-06-04: 75 mL via INTRAVENOUS

## 2024-06-04 MED ORDER — DIPHENHYDRAMINE HCL 50 MG/ML IJ SOLN
12.5000 mg | Freq: Once | INTRAMUSCULAR | Status: AC
  Administered 2024-06-04: 12.5 mg via INTRAVENOUS
  Filled 2024-06-04: qty 1

## 2024-06-04 MED ORDER — PROCHLORPERAZINE EDISYLATE 10 MG/2ML IJ SOLN
10.0000 mg | Freq: Once | INTRAMUSCULAR | Status: AC
  Administered 2024-06-04: 10 mg via INTRAVENOUS
  Filled 2024-06-04: qty 2

## 2024-06-04 MED ORDER — KETOROLAC TROMETHAMINE 15 MG/ML IJ SOLN
15.0000 mg | Freq: Once | INTRAMUSCULAR | Status: AC
  Administered 2024-06-04: 15 mg via INTRAVENOUS
  Filled 2024-06-04: qty 1

## 2024-06-04 MED ORDER — SODIUM CHLORIDE 0.9 % IV BOLUS
1000.0000 mL | Freq: Once | INTRAVENOUS | Status: AC
  Administered 2024-06-04: 1000 mL via INTRAVENOUS

## 2024-06-04 NOTE — ED Notes (Signed)
 Educated patient on proper procedure for obtaining a clean catch urine specimen. Patient verbalizes understanding.

## 2024-06-04 NOTE — Discharge Instructions (Signed)
 Follow-up with your primary care doctor return if symptoms worsen.  Continue 1000 mg of Tylenol  every 6 hours as needed for pain, continue 400 mg ibuprofen  every 8 hours as needed for pain.

## 2024-06-04 NOTE — ED Provider Notes (Signed)
 " [place] [facility] AT [facility] Provider Note   CSN: [id number] Arrival date & time: 06/04/24  1313     Patient presents with: Headache   Jillian Vega is a 49 y.o. female.   Patient here with headache history of migraines when she was a child having photophobia feels congested balance has been a little bit off some nausea.  No vision changes no weakness numbness tingling.  No fever chills neck pain.  Over-the-counter medications have not helped.  She has history of anxiety high cholesterol.  Denies any chest pain shortness of breath.  No ringing in the ear no vision loss or hearing loss.  Headache has been gradual mild and fluctuating the last 2 to 3 days.  The history is provided by the patient.       Prior to Admission medications  Medication Sig Start Date End Date Taking? Authorizing Provider  aspirin-acetaminophen -caffeine (EXCEDRIN MIGRAINE) 250-250-65 MG per tablet Take 1 tablet by mouth every 6 (six) hours as needed for migraine. Patient used this medication for a severe headache.    [provider]  dicyclomine  (BENTYL ) 20 MG tablet Take 1 tablet (20 mg total) by mouth 2 (two) times daily. 07/15/23   Hildegard, Amjad, PA-C  Elderberry 500 MG CAPS Take by mouth.    [provider]  Elderberry-Vitamin C-Zinc (ELDERBERRY IMMUNE HEALTH GUMMY PO) Take 2 capsules by mouth daily.    [provider]  ibuprofen  (ADVIL ) 800 MG tablet Take 1 tablet (800 mg total) by mouth every 8 (eight) hours as needed. Patient not taking: Reported on 07/15/2023 04/11/22   Amundson C Silva, Brook E, MD  Iron Combinations (IRON COMPLEX PO) Take by mouth in the morning and at bedtime.    [provider]  norethindrone  (ORTHO MICRONOR ) 0.35 MG tablet Take 1 tablet (0.35 mg total) by mouth daily. Patient not taking: Reported on 07/15/2023 04/18/23   Prentiss Riggs A, NP  ondansetron  (ZOFRAN ) 4 MG tablet Take 1 tablet (4 mg total) by mouth every 8  (eight) hours as needed for nausea or vomiting. 02/23/22   Tegeler, Lonni PARAS, MD  ondansetron  (ZOFRAN -ODT) 4 MG disintegrating tablet Take 1 tablet (4 mg total) by mouth every 8 (eight) hours as needed. 07/15/23   Hildegard Loge, PA-C  promethazine -dextromethorphan (PROMETHAZINE -DM) 6.25-15 MG/5ML syrup Take 5 mLs by mouth 4 (four) times daily as needed for cough. Patient not taking: Reported on 07/15/2023 12/17/22   Fleming, Zelda W, NP  sertraline  (ZOLOFT ) 50 MG tablet Take 1 tablet (50 mg total) by mouth daily. Patient not taking: Reported on 07/15/2023 04/18/23   Prentiss Riggs LABOR, NP    Allergies: Myfembree  [relugolix-estradiol-norethindrone ] and Other    Review of Systems  Updated Vital Signs BP 132/88 (BP Location: Right Arm)   Pulse 60   Temp 98.4 F (36.9 C) (Oral)   Resp 16   Ht 5' 4 (1.626 m)   Wt 70.8 kg   LMP 04/09/2022   SpO2 100%   BMI 26.79 kg/m   Physical Exam Vitals and nursing note reviewed.  Constitutional:      General: She is not in acute distress.    Appearance: She is well-developed. She is not ill-appearing.  HENT:     Head: Normocephalic and atraumatic.     Mouth/Throat:     Mouth: Mucous membranes are moist.     Pharynx: Oropharynx is clear.  Eyes:     Extraocular Movements: Extraocular movements intact.     Right  eye: Normal extraocular motion and no nystagmus.     Left eye: Normal extraocular motion and no nystagmus.     Conjunctiva/sclera: Conjunctivae normal.     Pupils: Pupils are equal, round, and reactive to light.  Cardiovascular:     Rate and Rhythm: Normal rate and regular rhythm.     Heart sounds: Normal heart sounds. No murmur heard. Pulmonary:     Effort: Pulmonary effort is normal. No respiratory distress.     Breath sounds: Normal breath sounds.  Abdominal:     Palpations: Abdomen is soft.     Tenderness: There is no abdominal tenderness.  Musculoskeletal:        General: No swelling.     Cervical back: Normal range of  motion and neck supple.  Skin:    General: Skin is warm and dry.     Capillary Refill: Capillary refill takes less than 2 seconds.  Neurological:     Mental Status: She is alert and oriented to person, place, and time.     Comments: 5+ out of 5 strength throughout, normal sensation, no drift, normal finger-to-nose pain, normal speech normal coordination  Psychiatric:        Mood and Affect: Mood normal.     (all labs ordered are listed, but only abnormal results are displayed) Labs Reviewed  CBC WITH DIFFERENTIAL/PLATELET - Abnormal; Notable for the following components:      Result Value   RBC 6.19 (*)    MCV 72.5 (*)    MCH 22.3 (*)    All other components within normal limits  RESP PANEL BY RT-PCR (RSV, FLU A&B, COVID)  [id number]  BASIC METABOLIC PANEL WITH GFR    EKG: None  Radiology: CT ANGIO HEAD NECK W WO CM Result Date: 06/04/2024 EXAM: CT HEAD WITHOUT CTA HEAD AND NECK WITH AND WITHOUT 06/04/2024 05:04:58 PM TECHNIQUE: CTA of the head and neck was performed with and without the administration of intravenous contrast. 75 mL iohexol  (OMNIPAQUE ) 350 MG/ML injection was administered. Noncontrast CT of the head with reconstructed 2-D images are also provided for review. Multiplanar 2D and/or 3D reformatted images are provided for review. Automated exposure control, iterative reconstruction, and/or weight based adjustment of the mA/kV was utilized to reduce the radiation dose to as low as reasonably achievable. COMPARISON: None available CLINICAL HISTORY: Acute neurologic deficit; stroke suspected. Headache. FINDINGS: CT HEAD: BRAIN AND VENTRICLES: There is no evidence of an acute infarct, intracranial hemorrhage, mass, midline shift, hydrocephalus, or extra-axial fluid collection. Cerebral volume is normal. ORBITS: No acute abnormality. SINUSES AND MASTOIDS: No acute abnormality. CTA NECK: AORTIC ARCH AND ARCH VESSELS: Normal variant aortic arch branching pattern with aberrant right  subclavian artery. No dissection or arterial injury. No significant stenosis of the brachiocephalic or subclavian arteries. CERVICAL CAROTID ARTERIES: No dissection, arterial injury, or hemodynamically significant stenosis by NASCET criteria. CERVICAL VERTEBRAL ARTERIES: Strongly dominant left and diffusely hypoplastic right vertebral arteries. No dissection, arterial injury, or significant stenosis. LUNGS AND MEDIASTINUM: Unremarkable. SOFT TISSUES: No acute abnormality. BONES: Dental caries. CTA HEAD: ANTERIOR CIRCULATION: The intracranial internal carotid arteries are widely patent. Anterior cerebral arteries (ACAs) and middle cerebral arteries (MCAs) are patent without evidence of a proximal branch occlusion or significant proximal stenosis. No aneurysm. POSTERIOR CIRCULATION: The intracranial vertebral arteries are patent with the right being particularly diminutive distal to the posterior inferior cerebellar artery (PICA) origin. Patent PICA and superior cerebellar artery (SCA) origins are visualized bilaterally. The basilar artery is widely patent with an  incidental fenestration noted proximally. There are small right and large left posterior communicating arteries with hypoplasia of the left P1 segment. Both posterior cerebral arteries (PCAs) are patent without evidence of a significant proximal stenosis. No aneurysm. OTHER: No dural venous sinus thrombosis on this non-dedicated study. IMPRESSION: 1. Negative CTA of the head and neck. No evidence of an acute intracranial abnormality. Electronically signed by: Dasie Hamburg MD 06/04/2024 05:15 PM EST RP Workstation: [id number]     Procedures   Medications Ordered in the ED  prochlorperazine  (COMPAZINE ) injection 10 mg (10 mg Intravenous Given 06/04/24 1530)  diphenhydrAMINE  (BENADRYL ) injection 12.5 mg (12.5 mg Intravenous Given 06/04/24 1530)  sodium chloride  0.9 % bolus 1,000 mL (1,000 mLs Intravenous New Bag/Given 06/04/24 1534)  iohexol  (OMNIPAQUE ) 350  MG/ML injection 75 mL (75 mLs Intravenous Contrast Given 06/04/24 1650)  ketorolac  (TORADOL ) 15 MG/ML injection 15 mg (15 mg Intravenous Given 06/04/24 1724)  dexamethasone  (DECADRON ) injection 10 mg (10 mg Intravenous Given 06/04/24 1724)                                    Medical Decision Making Amount and/or Complexity of Data Reviewed Labs: ordered. Radiology: ordered.  Risk Prescription drug management.   Jillian Vega is here with headache.  Normal vitals.  No fever.  History of migraines.  Normal neurological exam.  CTA of the head and neck was obtained that was normal.  No aneurysm no head bleed.  No brain mass.  She had unremarkable labs.  Headache cocktail was given with Compazine  Decadron  Toradol  Benadryl  with great improvement.  Overall I have no concern for meningitis or any other acute process.  No concern for stroke.  No concern for any thrombosis process.  I do suspect likely migraine type process.  Follow-up with primary care.  Continue Tylenol  and ibuprofen .  Told to return if symptoms worsen.  Discharge.  I reviewed interpreted labs and imaging.  This chart was dictated using voice recognition software.  Despite best efforts to proofread,  errors can occur which can change the documentation meaning.      Final diagnoses:  Migraine without aura and without status migrainosus, not intractable    ED Discharge Orders     None          Ruthe Cornet, DO 06/04/24 1747  "

## 2024-06-04 NOTE — ED Notes (Signed)
 Patient transferred from waiting room to ED treatment room. Assuming pt care at this time.

## 2024-06-04 NOTE — ED Notes (Signed)
 Patient denies any chance of pregnancy prior to medication administration.

## 2024-06-04 NOTE — ED Notes (Signed)
 Extra urine sample delivered to lab at this time

## 2024-06-04 NOTE — ED Triage Notes (Signed)
 Headache migraine , dizziness x 1 day , off balance . Recurrent intensively today . No vision changed today .
